# Patient Record
Sex: Female | Born: 1945 | Race: White | Hispanic: No | Marital: Married | Smoking: Never smoker
Health system: Southern US, Community
[De-identification: ages and names within clinical notes are randomized; demographics above are authoritative.]

## PROBLEM LIST (undated history)

## (undated) DIAGNOSIS — D510 Vitamin B12 deficiency anemia due to intrinsic factor deficiency: Secondary | ICD-10-CM

## (undated) DIAGNOSIS — F419 Anxiety disorder, unspecified: Secondary | ICD-10-CM

## (undated) DIAGNOSIS — G7100 Muscular dystrophy, unspecified: Secondary | ICD-10-CM

## (undated) DIAGNOSIS — I482 Chronic atrial fibrillation, unspecified: Secondary | ICD-10-CM

## (undated) HISTORY — DX: Vitamin B12 deficiency anemia due to intrinsic factor deficiency: D51.0

## (undated) HISTORY — DX: Anxiety disorder, unspecified: F41.9

## (undated) HISTORY — PX: TONSILLECTOMY: SUR1361

## (undated) HISTORY — PX: OTHER SURGICAL HISTORY: SHX169

## (undated) HISTORY — DX: Chronic atrial fibrillation, unspecified: I48.20

## (undated) HISTORY — DX: Muscular dystrophy, unspecified: G71.00

---

## 2006-01-15 ENCOUNTER — Ambulatory Visit: Payer: Self-pay | Admitting: Internal Medicine

## 2006-01-23 ENCOUNTER — Ambulatory Visit: Payer: Self-pay | Admitting: *Deleted

## 2006-02-04 ENCOUNTER — Ambulatory Visit: Payer: Self-pay | Admitting: Internal Medicine

## 2006-02-11 ENCOUNTER — Ambulatory Visit: Payer: Self-pay

## 2006-02-11 ENCOUNTER — Encounter: Payer: Self-pay | Admitting: Internal Medicine

## 2006-05-05 ENCOUNTER — Ambulatory Visit: Payer: Self-pay | Admitting: Internal Medicine

## 2006-05-05 LAB — CONVERTED CEMR LAB
Albumin: 3.8 g/dL (ref 3.5–5.2)
Basophils Absolute: 0 10*3/uL (ref 0.0–0.1)
CO2: 31 meq/L (ref 19–32)
Chloride: 104 meq/L (ref 96–112)
Creatinine, Ser: 0.7 mg/dL (ref 0.4–1.2)
Eosinophil percent: 2.4 % (ref 0.0–5.0)
HCT: 37.8 % (ref 36.0–46.0)
Hemoglobin: 12.8 g/dL (ref 12.0–15.0)
MCHC: 33.9 g/dL (ref 30.0–36.0)
MCV: 93.8 fL (ref 78.0–100.0)
Monocytes Absolute: 0.3 10*3/uL (ref 0.2–0.7)
Monocytes Relative: 5.5 % (ref 3.0–11.0)
Neutro Abs: 2.6 10*3/uL (ref 1.4–7.7)
Neutrophils Relative %: 56.4 % (ref 43.0–77.0)
RBC: 4.03 M/uL (ref 3.87–5.11)
RDW: 13.2 % (ref 11.5–14.6)
Sodium: 142 meq/L (ref 135–145)
Total Bilirubin: 0.5 mg/dL (ref 0.3–1.2)
Vitamin B-12: >1500 pg/mL — ABNORMAL HIGH (ref 211–911)

## 2006-05-26 ENCOUNTER — Other Ambulatory Visit: Admission: RE | Admit: 2006-05-26 | Discharge: 2006-05-26 | Payer: Self-pay | Admitting: Obstetrics & Gynecology

## 2006-07-02 ENCOUNTER — Encounter: Admission: RE | Admit: 2006-07-02 | Discharge: 2006-07-02 | Payer: Self-pay | Admitting: Obstetrics & Gynecology

## 2006-07-16 ENCOUNTER — Ambulatory Visit: Payer: Self-pay | Admitting: Internal Medicine

## 2006-08-13 ENCOUNTER — Ambulatory Visit: Payer: Self-pay | Admitting: Internal Medicine

## 2006-11-19 ENCOUNTER — Ambulatory Visit: Payer: Self-pay | Admitting: Internal Medicine

## 2006-11-19 ENCOUNTER — Encounter: Payer: Self-pay | Admitting: Internal Medicine

## 2006-11-19 DIAGNOSIS — G7109 Other specified muscular dystrophies: Secondary | ICD-10-CM

## 2006-11-19 DIAGNOSIS — D510 Vitamin B12 deficiency anemia due to intrinsic factor deficiency: Secondary | ICD-10-CM

## 2006-11-19 DIAGNOSIS — I4891 Unspecified atrial fibrillation: Secondary | ICD-10-CM | POA: Insufficient documentation

## 2007-03-17 ENCOUNTER — Ambulatory Visit (HOSPITAL_COMMUNITY): Admission: RE | Admit: 2007-03-17 | Discharge: 2007-03-17 | Payer: Self-pay | Admitting: Internal Medicine

## 2007-04-01 ENCOUNTER — Ambulatory Visit: Payer: Self-pay | Admitting: Internal Medicine

## 2007-04-06 ENCOUNTER — Ambulatory Visit: Payer: Self-pay | Admitting: Internal Medicine

## 2007-04-13 ENCOUNTER — Ambulatory Visit: Payer: Self-pay | Admitting: Cardiology

## 2007-04-15 ENCOUNTER — Encounter: Payer: Self-pay | Admitting: Internal Medicine

## 2007-04-15 ENCOUNTER — Ambulatory Visit: Payer: Self-pay

## 2007-04-20 ENCOUNTER — Ambulatory Visit: Payer: Self-pay | Admitting: Internal Medicine

## 2007-04-27 ENCOUNTER — Ambulatory Visit: Payer: Self-pay | Admitting: Cardiology

## 2007-05-11 ENCOUNTER — Ambulatory Visit: Payer: Self-pay | Admitting: Cardiology

## 2007-05-25 ENCOUNTER — Ambulatory Visit: Payer: Self-pay | Admitting: Internal Medicine

## 2007-06-30 ENCOUNTER — Ambulatory Visit: Payer: Self-pay | Admitting: Cardiovascular Disease

## 2007-07-20 ENCOUNTER — Other Ambulatory Visit: Admission: RE | Admit: 2007-07-20 | Discharge: 2007-07-20 | Payer: Self-pay | Admitting: Obstetrics and Gynecology

## 2007-07-28 ENCOUNTER — Ambulatory Visit: Payer: Self-pay | Admitting: Cardiology

## 2007-07-29 ENCOUNTER — Telehealth (INDEPENDENT_AMBULATORY_CARE_PROVIDER_SITE_OTHER): Payer: Self-pay | Admitting: *Deleted

## 2007-08-26 ENCOUNTER — Ambulatory Visit: Payer: Self-pay | Admitting: Cardiology

## 2007-09-01 ENCOUNTER — Ambulatory Visit (HOSPITAL_COMMUNITY): Admission: RE | Admit: 2007-09-01 | Discharge: 2007-09-01 | Payer: Self-pay | Admitting: Obstetrics and Gynecology

## 2007-09-24 ENCOUNTER — Ambulatory Visit: Payer: Self-pay | Admitting: Cardiovascular Disease

## 2007-10-26 ENCOUNTER — Ambulatory Visit: Payer: Self-pay | Admitting: Internal Medicine

## 2007-10-26 ENCOUNTER — Ambulatory Visit: Payer: Self-pay | Admitting: Cardiology

## 2007-11-23 ENCOUNTER — Ambulatory Visit: Payer: Self-pay | Admitting: Cardiology

## 2007-12-23 ENCOUNTER — Ambulatory Visit: Payer: Self-pay | Admitting: Cardiovascular Disease

## 2008-01-04 ENCOUNTER — Ambulatory Visit (HOSPITAL_COMMUNITY): Admission: RE | Admit: 2008-01-04 | Discharge: 2008-01-04 | Payer: Self-pay | Admitting: Internal Medicine

## 2008-01-25 ENCOUNTER — Ambulatory Visit: Payer: Self-pay | Admitting: Cardiology

## 2008-02-08 ENCOUNTER — Ambulatory Visit: Payer: Self-pay | Admitting: Cardiology

## 2008-02-15 ENCOUNTER — Ambulatory Visit: Payer: Self-pay | Admitting: Cardiology

## 2008-03-01 ENCOUNTER — Ambulatory Visit: Payer: Self-pay | Admitting: Cardiology

## 2008-03-14 ENCOUNTER — Ambulatory Visit: Payer: Self-pay | Admitting: Cardiology

## 2008-04-04 ENCOUNTER — Ambulatory Visit: Payer: Self-pay | Admitting: Cardiology

## 2008-05-09 ENCOUNTER — Ambulatory Visit: Payer: Self-pay | Admitting: Cardiology

## 2008-05-30 ENCOUNTER — Ambulatory Visit: Payer: Self-pay | Admitting: Cardiology

## 2008-06-27 ENCOUNTER — Ambulatory Visit: Payer: Self-pay | Admitting: Cardiology

## 2008-07-14 ENCOUNTER — Ambulatory Visit: Payer: Self-pay | Admitting: Cardiology

## 2008-08-01 ENCOUNTER — Other Ambulatory Visit: Admission: RE | Admit: 2008-08-01 | Discharge: 2008-08-01 | Payer: Self-pay | Admitting: Obstetrics and Gynecology

## 2008-08-04 ENCOUNTER — Ambulatory Visit: Payer: Self-pay | Admitting: Cardiology

## 2008-08-17 ENCOUNTER — Ambulatory Visit: Payer: Self-pay | Admitting: Cardiology

## 2008-08-18 ENCOUNTER — Ambulatory Visit: Payer: Self-pay

## 2008-08-22 ENCOUNTER — Ambulatory Visit: Payer: Self-pay | Admitting: Cardiology

## 2008-09-05 ENCOUNTER — Ambulatory Visit: Payer: Self-pay | Admitting: Cardiology

## 2008-09-12 ENCOUNTER — Ambulatory Visit (HOSPITAL_COMMUNITY): Admission: RE | Admit: 2008-09-12 | Discharge: 2008-09-12 | Payer: Self-pay | Admitting: Obstetrics and Gynecology

## 2008-09-22 DIAGNOSIS — I10 Essential (primary) hypertension: Secondary | ICD-10-CM | POA: Insufficient documentation

## 2008-09-22 DIAGNOSIS — R079 Chest pain, unspecified: Secondary | ICD-10-CM

## 2008-09-22 DIAGNOSIS — F411 Generalized anxiety disorder: Secondary | ICD-10-CM | POA: Insufficient documentation

## 2008-10-03 ENCOUNTER — Ambulatory Visit: Payer: Self-pay | Admitting: Cardiology

## 2008-11-03 ENCOUNTER — Ambulatory Visit: Payer: Self-pay | Admitting: Cardiology

## 2008-12-08 ENCOUNTER — Ambulatory Visit: Payer: Self-pay | Admitting: Cardiology

## 2009-01-12 ENCOUNTER — Ambulatory Visit: Payer: Self-pay | Admitting: Cardiology

## 2009-01-31 ENCOUNTER — Ambulatory Visit: Payer: Self-pay | Admitting: Internal Medicine

## 2009-02-01 ENCOUNTER — Telehealth: Payer: Self-pay | Admitting: Internal Medicine

## 2009-02-03 ENCOUNTER — Encounter (INDEPENDENT_AMBULATORY_CARE_PROVIDER_SITE_OTHER): Payer: Self-pay | Admitting: *Deleted

## 2009-02-06 ENCOUNTER — Encounter: Payer: Self-pay | Admitting: *Deleted

## 2009-02-09 ENCOUNTER — Ambulatory Visit: Payer: Self-pay | Admitting: Cardiology

## 2009-03-09 ENCOUNTER — Ambulatory Visit: Payer: Self-pay

## 2009-03-09 LAB — CONVERTED CEMR LAB: POC INR: 3.7

## 2009-04-10 ENCOUNTER — Ambulatory Visit: Payer: Self-pay | Admitting: Cardiology

## 2009-04-10 LAB — CONVERTED CEMR LAB: POC INR: 4.6

## 2009-04-26 ENCOUNTER — Ambulatory Visit: Payer: Self-pay | Admitting: Cardiology

## 2009-05-22 ENCOUNTER — Encounter: Payer: Self-pay | Admitting: Internal Medicine

## 2009-05-25 ENCOUNTER — Telehealth: Payer: Self-pay | Admitting: Cardiology

## 2009-05-25 ENCOUNTER — Encounter: Payer: Self-pay | Admitting: Cardiology

## 2009-05-25 LAB — CONVERTED CEMR LAB
INR: 2.07
Prothrombin Time: 23.1 s

## 2009-06-09 ENCOUNTER — Encounter: Payer: Self-pay | Admitting: Internal Medicine

## 2009-06-14 ENCOUNTER — Ambulatory Visit: Payer: Self-pay | Admitting: Cardiology

## 2009-06-14 LAB — CONVERTED CEMR LAB: POC INR: 2.1

## 2009-07-12 ENCOUNTER — Ambulatory Visit: Payer: Self-pay | Admitting: Cardiology

## 2009-08-10 ENCOUNTER — Ambulatory Visit: Payer: Self-pay | Admitting: Cardiology

## 2009-09-06 ENCOUNTER — Ambulatory Visit: Payer: Self-pay | Admitting: Cardiology

## 2009-09-14 ENCOUNTER — Ambulatory Visit (HOSPITAL_COMMUNITY): Admission: RE | Admit: 2009-09-14 | Discharge: 2009-09-14 | Payer: Self-pay | Admitting: Internal Medicine

## 2009-10-09 ENCOUNTER — Ambulatory Visit: Payer: Self-pay | Admitting: Cardiovascular Disease

## 2009-11-06 ENCOUNTER — Ambulatory Visit: Payer: Self-pay | Admitting: Cardiology

## 2009-12-04 ENCOUNTER — Ambulatory Visit: Payer: Self-pay | Admitting: Cardiology

## 2009-12-04 LAB — CONVERTED CEMR LAB: POC INR: 2.3

## 2010-01-01 ENCOUNTER — Ambulatory Visit: Payer: Self-pay | Admitting: Cardiology

## 2010-01-01 LAB — CONVERTED CEMR LAB: POC INR: 2.5

## 2010-01-15 ENCOUNTER — Ambulatory Visit (HOSPITAL_COMMUNITY): Admission: RE | Admit: 2010-01-15 | Discharge: 2010-01-15 | Payer: Self-pay | Admitting: Internal Medicine

## 2010-01-31 ENCOUNTER — Ambulatory Visit: Payer: Self-pay | Admitting: Cardiology

## 2010-01-31 LAB — CONVERTED CEMR LAB: POC INR: 3.6

## 2010-02-21 ENCOUNTER — Ambulatory Visit: Payer: Self-pay | Admitting: Cardiology

## 2010-02-21 LAB — CONVERTED CEMR LAB: POC INR: 3.1

## 2010-03-08 ENCOUNTER — Encounter: Payer: Self-pay | Admitting: Internal Medicine

## 2010-03-21 ENCOUNTER — Ambulatory Visit: Payer: Self-pay | Admitting: Cardiology

## 2010-03-21 LAB — CONVERTED CEMR LAB: POC INR: 4

## 2010-04-02 ENCOUNTER — Ambulatory Visit: Payer: Self-pay | Admitting: Internal Medicine

## 2010-04-04 ENCOUNTER — Telehealth: Payer: Self-pay | Admitting: Internal Medicine

## 2010-04-09 ENCOUNTER — Telehealth: Payer: Self-pay | Admitting: Cardiology

## 2010-04-10 ENCOUNTER — Encounter: Payer: Self-pay | Admitting: Cardiology

## 2010-04-10 ENCOUNTER — Encounter: Payer: Self-pay | Admitting: Internal Medicine

## 2010-04-10 ENCOUNTER — Telehealth: Payer: Self-pay | Admitting: Cardiology

## 2010-04-10 LAB — CONVERTED CEMR LAB: Prothrombin Time: 24.3 s — ABNORMAL HIGH (ref 11.6–15.2)

## 2010-04-11 ENCOUNTER — Ambulatory Visit: Payer: Self-pay | Admitting: Internal Medicine

## 2010-04-13 LAB — CONVERTED CEMR LAB
CO2: 29 meq/L (ref 19–32)
Chloride: 103 meq/L (ref 96–112)
Creatinine, Ser: 0.49 mg/dL (ref 0.40–1.20)
Free Thyroxine Index: 2.4 (ref 1.0–3.9)
Potassium: 4.2 meq/L (ref 3.5–5.3)
Sodium: 142 meq/L (ref 135–145)
TSH: 1.86 microintl units/mL (ref 0.350–4.500)
Triglycerides: 106 mg/dL (ref <150)

## 2010-05-02 ENCOUNTER — Ambulatory Visit: Payer: Self-pay | Admitting: Cardiology

## 2010-05-02 LAB — CONVERTED CEMR LAB: POC INR: 3.2

## 2010-05-30 ENCOUNTER — Ambulatory Visit: Payer: Self-pay | Admitting: Cardiovascular Disease

## 2010-05-30 ENCOUNTER — Ambulatory Visit (HOSPITAL_COMMUNITY)
Admission: RE | Admit: 2010-05-30 | Discharge: 2010-05-30 | Payer: Self-pay | Source: Home / Self Care | Admitting: Internal Medicine

## 2010-05-30 LAB — CONVERTED CEMR LAB: POC INR: 3.4

## 2010-06-27 ENCOUNTER — Ambulatory Visit: Admission: RE | Admit: 2010-06-27 | Discharge: 2010-06-27 | Payer: Self-pay | Source: Home / Self Care

## 2010-06-27 LAB — CONVERTED CEMR LAB: POC INR: 2.3

## 2010-07-24 NOTE — Medication Information (Signed)
Summary: ccr-lr  Anticoagulant Therapy  Managed by: Vashti Hey, RN PCP: Marisue Brooklyn Supervising MD: Diona Browner MD, Remi Deter Indication 1: Atrial Fibrillation (ICD-427.31) Lab Used: [facility] [place] Site: [place] INR POC 3.6  Dietary changes: no    Health status changes: yes       Details: chest congestion  Bleeding/hemorrhagic complications: no    Recent/future hospitalizations: no    Any changes in medication regimen? yes       Details: Took z pack 2 weeks ago   Levaquin x 5 days  started 01/30/10      Allergies: 1)  ! Biaxin  Anticoagulation Management History:      The patient is taking warfarin and comes in today for a routine follow up visit.  Negative risk factors for bleeding include an age less than 34 years old.  The bleeding index is 'low risk'.  Positive CHADS2 values include History of HTN.  Negative CHADS2 values include Age > 63 years old.  The start date was 06/24/2000.  Her last INR was 2.07.  Anticoagulation responsible provider: Diona Browner MD, Remi Deter.  INR POC: 3.6.  Cuvette Lot#: [id number].  Exp: 10/11.    Anticoagulation Management Assessment/Plan:      The patient's current anticoagulation dose is Warfarin sodium 5 mg tabs: Use as directed by Anticoagulation Clinic.  The target INR is 2 - 3.  The next INR is due 02/21/2010.  Anticoagulation instructions were given to patient.  Results were reviewed/authorized by Vashti Hey, RN.  She was notified by Vashti Hey RN.         Prior Anticoagulation Instructions: INR 2.5 Continue coumadin 10mg  once daily except 7.5mg  on Tuesdays and Saturdays  Current Anticoagulation Instructions: INR 3.6 Hold coumadin tonight, take 1 tablet on Thursday then resume 2 tablets once daily except 1 1/2 tablets on Tuesdays and Saturdays Has 4 days of Levaquin left

## 2010-07-24 NOTE — Medication Information (Signed)
Summary: ccr-lr  Anticoagulant Therapy  Managed by: Vashti Hey, RN PCP: Marisue Brooklyn Supervising MD: Dietrich Pates MD, Molly Maduro Indication 1: Atrial Fibrillation (ICD-427.31) Lab Used: [facility] [place] Site: [place] INR POC 2.7  Dietary changes: no    Health status changes: no    Bleeding/hemorrhagic complications: no    Recent/future hospitalizations: no    Any changes in medication regimen? no    Recent/future dental: no  Any missed doses?: no       Is patient compliant with meds? yes       Allergies: 1)  ! Biaxin  Anticoagulation Management History:      The patient is taking warfarin and comes in today for a routine follow up visit.  Negative risk factors for bleeding include an age less than 86 years old.  The bleeding index is 'low risk'.  Positive CHADS2 values include History of HTN.  Negative CHADS2 values include Age > 79 years old.  The start date was 06/24/2000.  Her last INR was 2.07.  Anticoagulation responsible provider: Dietrich Pates MD, Molly Maduro.  INR POC: 2.7.  Cuvette Lot#: [id number].  Exp: 10/11.    Anticoagulation Management Assessment/Plan:      The patient's current anticoagulation dose is Warfarin sodium 5 mg tabs: Use as directed by Anticoagulation Clinic.  The target INR is 2 - 3.  The next INR is due 12/04/2009.  Anticoagulation instructions were given to patient.  Results were reviewed/authorized by Vashti Hey, RN.  She was notified by Vashti Hey RN.         Prior Anticoagulation Instructions: INR 3.0 Continue coumadin 10mg  once daily except 7.5mg  on Tuesdays and Saturdays  Current Anticoagulation Instructions: INR 2.7 Continue coumadin 10mg  once daily except 7.5mg  on Tuesdays and Saturdays

## 2010-07-24 NOTE — Medication Information (Signed)
Summary: ccr-lr  Anticoagulant Therapy  Managed by: Vashti Hey, RN PCP: Marisue Brooklyn Supervising MD: Daleen Squibb MD, Maisie Fus Indication 1: Atrial Fibrillation (ICD-427.31) Lab Used: [facility] [place] Site: [place] INR POC 3.4  Dietary changes: no    Health status changes: no    Bleeding/hemorrhagic complications: no    Recent/future hospitalizations: no    Any changes in medication regimen? no    Recent/future dental: no  Any missed doses?: no       Is patient compliant with meds? yes       Allergies: 1)  ! Biaxin  Anticoagulation Management History:      The patient is taking warfarin and comes in today for a routine follow up visit.  Negative risk factors for bleeding include an age less than 51 years old.  The bleeding index is 'low risk'.  Positive CHADS2 values include History of HTN.  Negative CHADS2 values include Age > 89 years old.  The start date was 06/24/2000.  Her last INR was 2.07.  Anticoagulation responsible provider: Daleen Squibb MD, Maisie Fus.  INR POC: 3.4.  Cuvette Lot#: [id number].  Exp: 10/11.    Anticoagulation Management Assessment/Plan:      The patient's current anticoagulation dose is Warfarin sodium 5 mg tabs: Use as directed by Anticoagulation Clinic.  The target INR is 2 - 3.  The next INR is due 09/06/2009.  Anticoagulation instructions were given to patient.  Results were reviewed/authorized by Vashti Hey, RN.  She was notified by Vashti Hey RN.         Prior Anticoagulation Instructions: INR 3.9 Hold coumadin tonight then decrease dose to 2 tablets once daily except 1 1/2 tablets on Tuesdays and Saturdays  Current Anticoagulation Instructions: INR 3.4   Hold coumadin tonight then resume 10mg  once daily except 7.5mg  on Tuesdays and Saturdays

## 2010-07-24 NOTE — Medication Information (Signed)
Summary: ccr-lr  Anticoagulant Therapy  Managed by: Vashti Hey, RN PCP: Marisue Brooklyn Supervising MD: Eden Emms MD, Theron Arista Indication 1: Atrial Fibrillation (ICD-427.31) Lab Used: [facility]  Site: [place] INR POC 3.0  Dietary changes: no    Health status changes: no    Bleeding/hemorrhagic complications: no    Recent/future hospitalizations: no    Any changes in medication regimen? no    Recent/future dental: no  Any missed doses?: no       Is patient compliant with meds? yes       Allergies: 1)  ! Biaxin  Anticoagulation Management History:      The patient is taking warfarin and comes in today for a routine follow up visit.  Negative risk factors for bleeding include an age less than 69 years old.  The bleeding index is 'low risk'.  Positive CHADS2 values include History of HTN.  Negative CHADS2 values include Age > 16 years old.  The start date was 06/24/2000.  Her last INR was 2.07.  Anticoagulation responsible provider: Eden Emms MD, Theron Arista.  INR POC: 3.0.  Cuvette Lot#: [id number].  Exp: 10/11.    Anticoagulation Management Assessment/Plan:      The patient's current anticoagulation dose is Warfarin sodium 5 mg tabs: Use as directed by Anticoagulation Clinic.  The target INR is 2 - 3.  The next INR is due 11/06/2009.  Anticoagulation instructions were given to patient.  Results were reviewed/authorized by Vashti Hey, RN.  She was notified by Vashti Hey RN.         Prior Anticoagulation Instructions: INR 2.4 Continue coumadin 10mg  once daily except 7.5mg  on Tuesdays and Saturdays  Current Anticoagulation Instructions: INR 3.0 Continue coumadin 10mg  once daily except 7.5mg  on Tuesdays and Saturdays

## 2010-07-24 NOTE — Medication Information (Signed)
Summary: ccr-lr  Anticoagulant Therapy  Managed by: Vashti Hey, RN PCP: Marisue Brooklyn Supervising MD: Dietrich Pates MD, Molly Maduro Indication 1: Atrial Fibrillation (ICD-427.31) Lab Used: [facility] [place] Site: [place] INR POC 2.3  Dietary changes: no    Health status changes: no    Bleeding/hemorrhagic complications: no    Recent/future hospitalizations: no    Any changes in medication regimen? no    Recent/future dental: no  Any missed doses?: no       Is patient compliant with meds? yes       Allergies: 1)  ! Biaxin  Anticoagulation Management History:      The patient is taking warfarin and comes in today for a routine follow up visit.  Negative risk factors for bleeding include an age less than 30 years old.  The bleeding index is 'low risk'.  Positive CHADS2 values include History of HTN.  Negative CHADS2 values include Age > 87 years old.  The start date was 06/24/2000.  Her last INR was 2.07.  Anticoagulation responsible provider: Dietrich Pates MD, Molly Maduro.  INR POC: 2.3.  Cuvette Lot#: [id number].  Exp: 10/11.    Anticoagulation Management Assessment/Plan:      The patient's current anticoagulation dose is Warfarin sodium 5 mg tabs: Use as directed by Anticoagulation Clinic.  The target INR is 2 - 3.  The next INR is due 01/01/2010.  Anticoagulation instructions were given to patient.  Results were reviewed/authorized by Vashti Hey, RN.  She was notified by Vashti Hey RN.         Prior Anticoagulation Instructions: INR 2.7 Continue coumadin 10mg  once daily except 7.5mg  on Tuesdays and Saturdays  Current Anticoagulation Instructions: INR 2.3 Continue coumadin 10mg  once daily except 7.5mg  once daily on Tuesdays and Saturdays

## 2010-07-24 NOTE — Medication Information (Signed)
Summary: ccr-lr  Anticoagulant Therapy  Managed by: Vashti Hey, RN PCP: Marisue Brooklyn Supervising MD: Dietrich Pates MD, Molly Maduro Indication 1: Atrial Fibrillation (ICD-427.31) Lab Used: [facility] [place] Site: [place] INR POC 2.5  Dietary changes: no    Health status changes: no    Bleeding/hemorrhagic complications: no    Recent/future hospitalizations: no    Any changes in medication regimen? no    Recent/future dental: no  Any missed doses?: no       Is patient compliant with meds? yes       Allergies: 1)  ! Biaxin  Anticoagulation Management History:      The patient is taking warfarin and comes in today for a routine follow up visit.  Negative risk factors for bleeding include an age less than 46 years old.  The bleeding index is 'low risk'.  Positive CHADS2 values include History of HTN.  Negative CHADS2 values include Age > 75 years old.  The start date was 06/24/2000.  Her last INR was 2.07.  Anticoagulation responsible provider: Dietrich Pates MD, Molly Maduro.  INR POC: 2.5.  Cuvette Lot#: [id number].  Exp: 10/11.    Anticoagulation Management Assessment/Plan:      The patient's current anticoagulation dose is Warfarin sodium 5 mg tabs: Use as directed by Anticoagulation Clinic.  The target INR is 2 - 3.  The next INR is due 01/31/2010.  Anticoagulation instructions were given to patient.  Results were reviewed/authorized by Vashti Hey, RN.  She was notified by Vashti Hey RN.         Prior Anticoagulation Instructions: INR 2.3 Continue coumadin 10mg  once daily except 7.5mg  once daily on Tuesdays and Saturdays  Current Anticoagulation Instructions: INR 2.5 Continue coumadin 10mg  once daily except 7.5mg  on Tuesdays and Saturdays

## 2010-07-24 NOTE — Medication Information (Signed)
Summary: ccr-lr  Anticoagulant Therapy  Managed by: Vashti Hey, RN PCP: Marisue Brooklyn Supervising MD: Eden Emms MD, Theron Arista Indication 1: Atrial Fibrillation (ICD-427.31) Lab Used: [facility] [place] Site: [place] INR POC 3.4  Dietary changes: no    Health status changes: no    Bleeding/hemorrhagic complications: no    Recent/future hospitalizations: no    Any changes in medication regimen? no    Recent/future dental: no  Any missed doses?: no       Is patient compliant with meds? yes       Allergies: 1)  ! Biaxin  Anticoagulation Management History:      The patient is taking warfarin and comes in today for a routine follow up visit.  Negative risk factors for bleeding include an age less than 62 years old.  The bleeding index is 'low risk'.  Positive CHADS2 values include History of HTN.  Negative CHADS2 values include Age > 23 years old.  The start date was 06/24/2000.  Her last INR was 2.17.  Anticoagulation responsible provider: Eden Emms MD, Theron Arista.  INR POC: 3.4.  Cuvette Lot#: [id number].  Exp: 10/11.    Anticoagulation Management Assessment/Plan:      The patient's current anticoagulation dose is Warfarin sodium 5 mg tabs: Use as directed by Anticoagulation Clinic.  The target INR is 2 - 3.  The next INR is due 06/27/2010.  Anticoagulation instructions were given to patient.  Results were reviewed/authorized by Vashti Hey, RN.  She was notified by Vashti Hey RN.         Prior Anticoagulation Instructions: INR 3.2 Continue coumadin 10mg  once daily except 7.5mg  on Tuesdays and Saturdays  Current Anticoagulation Instructions: INR 3.4 Take coumadin 1 tablet tonight then resume 2 tablets once daily except 1 1/2 tablets on Tuesdays and Saturdays

## 2010-07-24 NOTE — Medication Information (Signed)
Summary: ccr-lr  Anticoagulant Therapy  Managed by: Vashti Hey, RN PCP: Marisue Brooklyn Supervising MD: Dietrich Pates MD, Molly Maduro Indication 1: Atrial Fibrillation (ICD-427.31) Lab Used: [facility] [place] Site: [place] INR POC 4.0  Dietary changes: no    Health status changes: no    Bleeding/hemorrhagic complications: no    Recent/future hospitalizations: no    Any changes in medication regimen? no    Recent/future dental: no  Any missed doses?: no       Is patient compliant with meds? yes       Allergies: 1)  ! Biaxin  Anticoagulation Management History:      The patient is taking warfarin and comes in today for a routine follow up visit.  Negative risk factors for bleeding include an age less than 72 years old.  The bleeding index is 'low risk'.  Positive CHADS2 values include History of HTN.  Negative CHADS2 values include Age > 12 years old.  The start date was 06/24/2000.  Her last INR was 2.07.  Anticoagulation responsible provider: Dietrich Pates MD, Molly Maduro.  INR POC: 4.0.  Cuvette Lot#: [id number].  Exp: 10/11.    Anticoagulation Management Assessment/Plan:      The patient's current anticoagulation dose is Warfarin sodium 5 mg tabs: Use as directed by Anticoagulation Clinic.  The target INR is 2 - 3.  The next INR is due 04/12/2010.  Anticoagulation instructions were given to patient.  Results were reviewed/authorized by Vashti Hey, RN.  She was notified by Vashti Hey RN.         Prior Anticoagulation Instructions: INR 3.1 Continue coumadin 10mg  once daily except 7.5mg  on Tuesdays and Saturdays  Current Anticoagulation Instructions: INR 4.0 Hold coumadin tonight, take 1 tablet tomorrow night then resume 2 tablets once daily except 1 1/2 tablets on Tuesdays and Saturdays

## 2010-07-24 NOTE — Assessment & Plan Note (Signed)
Summary: f1y  Medications Added AMLODIPINE BESYLATE 2.5 MG TABS (AMLODIPINE BESYLATE) Take one tablet by mouth daily VITAMIN B-12 500 MCG TABS (CYANOCOBALAMIN) Take 1 tablet by mouth once a day VITAMIN C 1000 MG TABS (ASCORBIC ACID) 2.000mg  daily        Visit Type:  1 year follow up Primary Provider:  Marisue Brooklyn  CC:  No complaints.  History of Present Illness: Hannah Villa is a 65 y/o woman with mild muscular dystrophy, chronic atrial fibrillation with h/o TIA and chest pain. She had a negative Myvoiew in 2008.  She saw Ward Givens in our office several months ago for recurrent CP a few days before her husband was going  in for prostate surgery. Had ETT/Myoview 08/18/08. Walked 8 mins on [address]. No CP. NO ST changes, Not gated due to AF. Mild fixed defect in anterior wall thought to be due to breast attenuation.  Returns for routine f/u. No longer exercising regularly (was walking on [place] for 30 mins/day). Has been apprehensive about her husband with recurrent prostate CA who is now undergoing chemo.   Denies CP or SOB. No palpitations, edema or orthopnea. No dizziness or presyncopal. Taking coumadin without bleeding.  Was seen in ophtho office in September and told that her retina had HTN changes. Got a cuff. SBPs 120-160s. (Averaging 135-140). Has had problems with hyperkalemia in past due to salt substitute.   Current Medications (verified): 1)  Metoprolol Succinate 25 Mg  Tb24 (Metoprolol Succinate) .Marland Kitchen.. 1 By Mouth Once Daily 2)  Aspirin 81 Mg Tbec (Aspirin) .... Take One Tablet By Mouth Daily 3)  Multivitamins   Tabs (Multiple Vitamin) .... Once Daily 4)  Calcium Carbonate-Vitamin D 1260mg /800 Iu  Tabs .... Once Daily 5)  Vitamin D (Ergocalciferol) 5000 Unit Caps (Ergocalciferol) .... Take One Tablet By Mouth Once Daily. 6)  Fish Oil   Oil (Fish Oil) .... 3000mg  Once Daily 7)  Flax   Oil 2600mg  (Flaxseed (Linseed)) .... Take 1 Tablet By Mouth Once A Day 8)  Zyrtec Allergy 10 Mg Tabs  (Cetirizine Hcl) .... Take One Tablet By Mouth Once Daily. 9)  Warfarin Sodium 5 Mg Tabs (Warfarin Sodium) .... Use As Directed By Anticoagulation Clinic 10)  Trazodone Hcl 50 Mg Tabs (Trazodone Hcl) .... 1/2 Tab At Bed Time 11)  Folic Acid 400 Mcg Tabs (Folic Acid) .Marland Kitchen.. 1 & 1/2 Tab Daily 12)  Magnesium + Zinc 400mg /15mg  .... Take 1 Tablet By Mouth Once A Day 13)  Vitamin B-12 500 Mcg Tabs (Cyanocobalamin) .... Take 1 Tablet By Mouth Once A Day 14)  Vitamin C 1000 Mg Tabs (Ascorbic Acid) .... 2.000mg  Daily  Allergies: 1)  ! Biaxin  Past History:  Past Medical History:  1. History of chest pain.     a.     Status post negative Myoview, 2008.     b.    ETT/Myoview 2/10.  Small fixed anterior defect. likely breast attenuation. No ischemia. 2. Chroni atrial fibrillation.     a.     Status post previous echocardiogram showing ejection      fraction of 65%.     b.     Restarted Coumadin therapy due to TIA in 2008 after being off coumadin for 1 year 3. History of muscular dystrophy.  (ICD-359.1) 4. Anxiety.  5. ANEMIA, PERNICIOUS (ICD-281.0)  Review of Systems       As per HPI and past medical history; otherwise all systems negative.   Vital Signs:  Patient profile:  65 year old female Height:      63 inches Weight:      139.50 pounds BMI:     24.80 Pulse rate:   48 / minute Pulse rhythm:   irregular Resp:     18 per minute BP sitting:   150 / 80  (left arm) Cuff size:   large  Vitals Entered By: Vikki Ports (April 02, 2010 11:16 AM)  Physical Exam  General:  Well appearing. no resp difficulty HEENT: normal Neck: supple. no JVD. Carotids 2+ bilat; no bruits. No lymphadenopathy or thryomegaly appreciated. Cor: PMI nondisplaced. Bradycardic. regular. No rubs, gallops, murmur. Lungs: clear Abdomen: soft, nontender, nondistended. Good bowel sounds. Extremities: no cyanosis, clubbing, rash, edema Neuro: alert & orientedx3, cranial nerves grossly intact. moves all 4  extremities w/o difficulty. affect pleasant    Impression & Recommendations:  Problem # 1:  ATRIAL FIBRILLATION (ICD-427.31) Is in sinus rhythm today (surprisingly) with marked sinus bradycardia and marked 1AVB. This is asymptomatic. Will stop lopressor. Continue coumadin. Will continue montiroing HRs on BP cuff. If persistently in the 40s or less will let us know and we will follow with 48 hr Holter. We did discuss the possibility of pacemaker in future. Will walk her around clinic today to see HR response to exercise. (got HR up to > 80 with mild hall walk which is good sign).    Problem # 2:  HYPERTENSION, UNSPECIFIED (ICD-401.9) BP elevated with evidence of end-organ damage (retinal changes). Will start amlodipine 2.5 once daily. Due for labs tomorrow including BMET (check k), thyroid, lipids and INR.   Other Orders: EKG w/ Interpretation (93000)  Patient Instructions: 1)  Your physician recommends that you schedule a follow-up appointment in: 6 months 2)  Your physician recommends that you return for fasting lab work tomorrow. 3)  Your physician has recommended you make the following change in your medication: Stop metoprolol. 4)  Start amlodipine 2.5 mg by mouth daily. Prescriptions: AMLODIPINE BESYLATE 2.5 MG TABS (AMLODIPINE BESYLATE) Take one tablet by mouth daily  #30 x 11   Entered by:   Dossie Arbour, RN, BSN   Authorized by:   Dolores Patty, MD, [facility]   Signed by:   Dossie Arbour, RN, BSN on 04/02/2010   Method used:   Electronically to        [facility] (retail)       [address]       [place], [place]  [address]       Ph: [phone] or [phone]       Fax: [phone]   RxID:   [id number]

## 2010-07-24 NOTE — Medication Information (Signed)
Summary: ccr-lr  Anticoagulant Therapy  Managed by: Vashti Hey, RN PCP: Marisue Brooklyn Supervising MD: Dietrich Pates MD, Molly Maduro Indication 1: Atrial Fibrillation (ICD-427.31) Lab Used: [facility] [place] Site: [place] INR POC 3.1  Dietary changes: no    Health status changes: no    Bleeding/hemorrhagic complications: no    Recent/future hospitalizations: no    Any changes in medication regimen? no    Recent/future dental: no  Any missed doses?: no       Is patient compliant with meds? yes       Allergies: 1)  ! Biaxin  Anticoagulation Management History:      Negative risk factors for bleeding include an age less than 78 years old.  The bleeding index is 'low risk'.  Positive CHADS2 values include History of HTN.  Negative CHADS2 values include Age > 65 years old.  The start date was 06/24/2000.  Her last INR was 2.07.  Anticoagulation responsible provider: Dietrich Pates MD, Molly Maduro.  INR POC: 3.1.  Exp: 10/11.    Anticoagulation Management Assessment/Plan:      The patient's current anticoagulation dose is Warfarin sodium 5 mg tabs: Use as directed by Anticoagulation Clinic.  The target INR is 2 - 3.  The next INR is due 03/21/2010.  Anticoagulation instructions were given to patient.  Results were reviewed/authorized by Vashti Hey, RN.         Prior Anticoagulation Instructions: INR 3.6 Hold coumadin tonight, take 1 tablet on Thursday then resume 2 tablets once daily except 1 1/2 tablets on Tuesdays and Saturdays Has 4 days of Levaquin left  Current Anticoagulation Instructions: INR 3.1 Continue coumadin 10mg  once daily except 7.5mg  on Tuesdays and Saturdays

## 2010-07-24 NOTE — Medication Information (Signed)
Summary: CCR  Anticoagulant Therapy  Managed by: Vashti Hey, RN PCP: Marisue Brooklyn Supervising MD: Daleen Squibb MD, Maisie Fus Indication 1: Atrial Fibrillation (ICD-427.31) Lab Used: [facility] [place] Site: [place] INR POC 2.4  Dietary changes: no    Health status changes: no    Bleeding/hemorrhagic complications: no    Recent/future hospitalizations: no    Any changes in medication regimen? no    Recent/future dental: no  Any missed doses?: no       Is patient compliant with meds? yes       Allergies: 1)  ! Biaxin  Anticoagulation Management History:      The patient is taking warfarin and comes in today for a routine follow up visit.  Negative risk factors for bleeding include an age less than 34 years old.  The bleeding index is 'low risk'.  Positive CHADS2 values include History of HTN.  Negative CHADS2 values include Age > 9 years old.  The start date was 06/24/2000.  Her last INR was 2.07.  Anticoagulation responsible provider: Daleen Squibb MD, Maisie Fus.  INR POC: 2.4.  Cuvette Lot#: [id number].  Exp: 10/11.    Anticoagulation Management Assessment/Plan:      The patient's current anticoagulation dose is Warfarin sodium 5 mg tabs: Use as directed by Anticoagulation Clinic.  The target INR is 2 - 3.  The next INR is due 10/09/2009.  Anticoagulation instructions were given to patient.  Results were reviewed/authorized by Vashti Hey, RN.  She was notified by Vashti Hey RN.         Prior Anticoagulation Instructions: INR 3.4   Hold coumadin tonight then resume 10mg  once daily except 7.5mg  on Tuesdays and Saturdays  Current Anticoagulation Instructions: INR 2.4 Continue coumadin 10mg  once daily except 7.5mg  on Tuesdays and Saturdays

## 2010-07-24 NOTE — Progress Notes (Signed)
Summary: coumadin management  Phone Note Outgoing Call   Call placed by: Vashti Hey RN,  April 10, 2010 1:53 PM Call placed to: Patient Reason for Call: Discuss lab or test results Summary of Call: Received fax from [facility] with results of PT/INR obtained on pt today.  PT 24.3  INR 2.17  Order given for pt to take coumadin 10mg  tonight then resume 10mg  once daily except 7.5mg  on Tuesdays and Saturdays. Initial call taken by: Vashti Hey RN,  April 10, 2010 1:58 PM     Anticoagulant Therapy  Managed by: Vashti Hey, RN PCP: Marisue Brooklyn Supervising MD: Diona Browner MD, Remi Deter Indication 1: Atrial Fibrillation (ICD-427.31) Lab Used: [facility] [place] Site: [place] PT 24.3  Dietary changes: no    Health status changes: no    Bleeding/hemorrhagic complications: no    Recent/future hospitalizations: no    Any changes in medication regimen? no    Recent/future dental: no  Any missed doses?: no       Is patient compliant with meds? yes         Anticoagulation Management History:      Her anticoagulation is being managed by telephone today.  Negative risk factors for bleeding include an age less than 21 years old.  The bleeding index is 'low risk'.  Positive CHADS2 values include History of HTN.  Negative CHADS2 values include Age > 15 years old.  The start date was 06/24/2000.  Her last INR was 2.17 and today's INR is 2.17.  Prothrombin time is 24.3.  Anticoagulation responsible provider: Diona Browner MD, Remi Deter.  Exp: 10/11.    Anticoagulation Management Assessment/Plan:      The patient's current anticoagulation dose is Warfarin sodium 5 mg tabs: Use as directed by Anticoagulation Clinic.  The target INR is 2 - 3.  The next INR is due 05/09/2010.  Anticoagulation instructions were given to patient.  Results were reviewed/authorized by Vashti Hey, RN.  She was notified by Vashti Hey RN.         Prior Anticoagulation Instructions: INR 4.0 Hold  coumadin tonight, take 1 tablet tomorrow night then resume 2 tablets once daily except 1 1/2 tablets on Tuesdays and Saturdays  Current Anticoagulation Instructions: INR 2.17 Take coumadin 10mg  tonight then resume 10mg  once daily except 7.5mg  on Tuesdays and Saturdays

## 2010-07-24 NOTE — Progress Notes (Signed)
Summary: Calling regarding having labs drawn.  Phone Note Call from Patient Call back at Home Phone [phone]   Caller: Patient Summary of Call: Pt calling with question about getting labs done in out [place] office,the pt husband is sick today and the pt don't want to drive here to this office. Initial call taken by: Judie Grieve,  April 04, 2010 8:22 AM  Follow-up for Phone Call        Left message to call back Meredith Staggers, RN  April 04, 2010 3:44 PM   spoke w/pt her husband was admitted to hospital so she hasn't gotten labs done, she will come in 1 day next week Meredith Staggers, RN  April 06, 2010 1:41 PM

## 2010-07-24 NOTE — Progress Notes (Signed)
Summary: PT WANTS TO TALK TO LISA  Phone Note Call from Patient Call back at Home Phone [phone]   Caller: PT Reason for Call: Talk to Nurse Summary of Call: PT WOULD LIKE TO SPEAK WITH LISA ABOUT A TEST THAT NEEDS TO BE DONE AND HER COUMADIN. Initial call taken by: Faythe Ghee,  April 09, 2010 9:02 AM  Follow-up for Phone Call        Wanted to have labs done in [place] instead of [place].  Lab orders fixed for pt to pick [place] this week.  Will call pt when I get results of INR. Follow-up by: Vashti Hey RN,  April 09, 2010 10:38 AM

## 2010-07-24 NOTE — Medication Information (Signed)
Summary: ccr-lr  Anticoagulant Therapy  Managed by: Vashti Hey, RN PCP: Marisue Brooklyn Supervising MD: Dietrich Pates MD, Molly Maduro Indication 1: Atrial Fibrillation (ICD-427.31) Lab Used: [facility] [place] Site: [place] INR POC 3.2  Dietary changes: no    Health status changes: no    Bleeding/hemorrhagic complications: no    Recent/future hospitalizations: no    Any changes in medication regimen? no    Recent/future dental: no  Any missed doses?: no       Is patient compliant with meds? yes       Allergies: 1)  ! Biaxin  Anticoagulation Management History:      The patient is taking warfarin and comes in today for a routine follow up visit.  Negative risk factors for bleeding include an age less than 60 years old.  The bleeding index is 'low risk'.  Positive CHADS2 values include History of HTN.  Negative CHADS2 values include Age > 29 years old.  The start date was 06/24/2000.  Her last INR was 2.17.  Anticoagulation responsible provider: Dietrich Pates MD, Molly Maduro.  INR POC: 3.2.  Cuvette Lot#: [id number].  Exp: 10/11.    Anticoagulation Management Assessment/Plan:      The patient's current anticoagulation dose is Warfarin sodium 5 mg tabs: Use as directed by Anticoagulation Clinic.  The target INR is 2 - 3.  The next INR is due 05/30/2010.  Anticoagulation instructions were given to patient.  Results were reviewed/authorized by Vashti Hey, RN.  She was notified by Vashti Hey RN.         Prior Anticoagulation Instructions: INR 2.17 Take coumadin 10mg  tonight then resume 10mg  once daily except 7.5mg  on Tuesdays and Saturdays  Current Anticoagulation Instructions: INR 3.2 Continue coumadin 10mg  once daily except 7.5mg  on Tuesdays and Saturdays

## 2010-07-24 NOTE — Letter (Signed)
Summary: [facility] Progress Note   [facility] Progress Note   Imported By: Roderic Ovens 04/13/2010 14:21:39  _____________________________________________________________________  External Attachment:    Type:   Image     Comment:   External Document

## 2010-07-24 NOTE — Medication Information (Signed)
Summary: ccr-lr  Anticoagulant Therapy  Managed by: Vashti Hey, RN PCP: Marisue Brooklyn Supervising MD: Diona Browner MD, Remi Deter Indication 1: Atrial Fibrillation (ICD-427.31) Lab Used: [facility] [place] Site: [place] INR POC 3.9  Dietary changes: no    Health status changes: no    Bleeding/hemorrhagic complications: yes       Details: ruptured blood vessel in eye  Recent/future hospitalizations: no    Any changes in medication regimen? no    Recent/future dental: no  Any missed doses?: no       Is patient compliant with meds? yes       Allergies: 1)  ! Biaxin  Anticoagulation Management History:      The patient is taking warfarin and comes in today for a routine follow up visit.  Negative risk factors for bleeding include an age less than 61 years old.  The bleeding index is 'low risk'.  Positive CHADS2 values include History of HTN.  Negative CHADS2 values include Age > 56 years old.  The start date was 06/24/2000.  Her last INR was 2.07.  Anticoagulation responsible provider: Diona Browner MD, Remi Deter.  INR POC: 3.9.  Cuvette Lot#: [id number].  Exp: 10/11.    Anticoagulation Management Assessment/Plan:      The patient's current anticoagulation dose is Warfarin sodium 5 mg tabs: Use as directed by Anticoagulation Clinic.  The target INR is 2 - 3.  The next INR is due 08/10/2009.  Anticoagulation instructions were given to patient.  Results were reviewed/authorized by Vashti Hey, RN.  She was notified by Vashti Hey RN.         Prior Anticoagulation Instructions: INR 2.1 Increase couamdin to 10mg  once daily except 7.5mg  on Tuesdays   Current Anticoagulation Instructions: INR 3.9 Hold coumadin tonight then decrease dose to 2 tablets once daily except 1 1/2 tablets on Tuesdays and Saturdays

## 2010-07-25 ENCOUNTER — Ambulatory Visit: Admit: 2010-07-25 | Payer: Self-pay

## 2010-07-26 NOTE — Medication Information (Signed)
Summary: ccr-lr  Anticoagulant Therapy  Managed by: Vashti Hey, RN PCP: Marisue Brooklyn Supervising MD: Dietrich Pates MD, Molly Maduro Indication 1: Atrial Fibrillation (ICD-427.31) Lab Used: [facility] [place] Site: [place] INR POC 2.3  Dietary changes: no    Health status changes: no    Bleeding/hemorrhagic complications: no    Recent/future hospitalizations: no    Any changes in medication regimen? no    Recent/future dental: no  Any missed doses?: no       Is patient compliant with meds? yes       Allergies: 1)  ! Biaxin  Anticoagulation Management History:      The patient is taking warfarin and comes in today for a routine follow up visit.  Negative risk factors for bleeding include an age less than 7 years old.  The bleeding index is 'low risk'.  Positive CHADS2 values include History of HTN.  Negative CHADS2 values include Age > 74 years old.  The start date was 06/24/2000.  Her last INR was 2.17.  Anticoagulation responsible provider: Dietrich Pates MD, Molly Maduro.  INR POC: 2.3.  Cuvette Lot#: [id number].  Exp: 10/11.    Anticoagulation Management Assessment/Plan:      The patient's current anticoagulation dose is Warfarin sodium 5 mg tabs: Use as directed by Anticoagulation Clinic.  The target INR is 2 - 3.  The next INR is due 07/25/2010.  Anticoagulation instructions were given to patient.  Results were reviewed/authorized by Vashti Hey, RN.  She was notified by Vashti Hey RN.         Prior Anticoagulation Instructions: INR 3.4 Take coumadin 1 tablet tonight then resume 2 tablets once daily except 1 1/2 tablets on Tuesdays and Saturdays  Current Anticoagulation Instructions: INR 2.3 Continue coumadin 10mg  once daily except 7.5mg  on Tuesdays and Saturdays

## 2010-07-30 ENCOUNTER — Encounter (INDEPENDENT_AMBULATORY_CARE_PROVIDER_SITE_OTHER): Payer: PRIVATE HEALTH INSURANCE

## 2010-07-30 ENCOUNTER — Encounter: Payer: Self-pay | Admitting: Cardiology

## 2010-07-30 DIAGNOSIS — Z7901 Long term (current) use of anticoagulants: Secondary | ICD-10-CM

## 2010-07-30 DIAGNOSIS — I4891 Unspecified atrial fibrillation: Secondary | ICD-10-CM

## 2010-07-30 LAB — CONVERTED CEMR LAB: POC INR: 2.5

## 2010-08-02 ENCOUNTER — Encounter (INDEPENDENT_AMBULATORY_CARE_PROVIDER_SITE_OTHER): Payer: Self-pay | Admitting: *Deleted

## 2010-08-09 NOTE — Letter (Signed)
Summary: Appointment - Missed  [facility] [place]  [address] [place] [address]   Phone: [phone]  Fax: [phone]     August 02, 2010 MRN: [id number]   [facility] [address] [place], [place]  [address]   Dear Ms. CLENNEY,  Our records indicate you missed your appointment on  07/25/10                      with Coumadin clinic       .                                    It is very important that we reach you to reschedule this appointment. We look forward to participating in your health care needs. Please contact us at the number listed above at your earliest convenience to reschedule this appointment.     Sincerely,    Glass blower/designer

## 2010-08-09 NOTE — Medication Information (Signed)
Summary: [facility] MISSED LAST APPT/TMJ  Anticoagulant Therapy Managed by: Vashti Hey, RN Patient Assessment Part 2:  Have you MISSED ANY DOSES or CHANGED TABLETS?  0  Have you had any BRUISING or BLEEDING ( nose or gum bleeds,blood in urine or stool)?  Have you STARTED or STOPPED any MEDICATIONS, including OTC meds,herbals or supplements?  Have you CHANGED your DIET, especially green vegetables,or ALCOHOL intake?  Have you had any ILLNESSES or HOSPITALIZATIONS?  Have you had any signs of CLOTTING?(chest discomfort,dizziness,shortness of breath,arms tingling,slurred speech,swelling or redness in leg)       Regimen Out:    Total Weekly: 65 mg mg  Next INR Due: 08/29/2010      Allergies: 1)  ! Biaxin  Anticoagulant Therapy  Managed by: Vashti Hey, RN PCP: Marisue Brooklyn Supervising MD: Dietrich Pates MD, Molly Maduro Indication 1: Atrial Fibrillation (ICD-427.31) Lab Used: [facility] [place] Site: [place] INR POC 2.5  Dietary changes: no    Health status changes: no    Bleeding/hemorrhagic complications: no    Recent/future hospitalizations: no    Any changes in medication regimen? no    Recent/future dental: no  Any missed doses?: no       Is patient compliant with meds? yes         Anticoagulation Management History:      The patient is taking warfarin and comes in today for a routine follow up visit.  Negative risk factors for bleeding include an age less than 59 years old.  The bleeding index is 'low risk'.  Positive CHADS2 values include History of HTN.  Negative CHADS2 values include Age > 44 years old.  The start date was 06/24/2000.  Her last INR was 2.17.  Anticoagulation responsible provider: Dietrich Pates MD, Molly Maduro.  INR POC: 2.5.  Cuvette Lot#: [id number].  Exp: 10/11.    Anticoagulation Management Assessment/Plan:      The patient's current anticoagulation dose is Warfarin sodium 5 mg tabs: Use as directed by Anticoagulation Clinic.   The target INR is 2 - 3.  The next INR is due 08/29/2010.  Anticoagulation instructions were given to patient.  Results were reviewed/authorized by Vashti Hey, RN.  She was notified by Vashti Hey RN.         Prior Anticoagulation Instructions: INR 2.3 Continue coumadin 10mg  once daily except 7.5mg  on Tuesdays and Saturdays  Current Anticoagulation Instructions: INR 2.5 Continue coumadin 10mg  once daily except 7.5mg  on Tuesdays and Saturdays

## 2010-08-30 ENCOUNTER — Encounter: Payer: Self-pay | Admitting: Cardiology

## 2010-08-30 ENCOUNTER — Encounter (INDEPENDENT_AMBULATORY_CARE_PROVIDER_SITE_OTHER): Payer: PRIVATE HEALTH INSURANCE

## 2010-08-30 DIAGNOSIS — I4891 Unspecified atrial fibrillation: Secondary | ICD-10-CM

## 2010-08-30 DIAGNOSIS — Z7901 Long term (current) use of anticoagulants: Secondary | ICD-10-CM

## 2010-08-30 LAB — CONVERTED CEMR LAB: POC INR: 2.2

## 2010-09-04 NOTE — Medication Information (Signed)
Summary: ccr-lr  Anticoagulant Therapy  Managed by: Vashti Hey, RN PCP: Marisue Brooklyn Supervising MD: Diona Browner MD, Remi Deter Indication 1: Atrial Fibrillation (ICD-427.31) Lab Used: [facility] [place] Site: [place] INR POC 2.2  Dietary changes: no    Health status changes: no    Bleeding/hemorrhagic complications: no    Recent/future hospitalizations: no    Any changes in medication regimen? no    Recent/future dental: no  Any missed doses?: no       Is patient compliant with meds? yes       Allergies: 1)  ! Biaxin  Anticoagulation Management History:      The patient is taking warfarin and comes in today for a routine follow up visit.  Negative risk factors for bleeding include an age less than 61 years old.  The bleeding index is 'low risk'.  Positive CHADS2 values include History of HTN.  Negative CHADS2 values include Age > 39 years old.  The start date was 06/24/2000.  Her last INR was 2.17.  Anticoagulation responsible provider: Diona Browner MD, Remi Deter.  INR POC: 2.2.  Cuvette Lot#: [id number].  Exp: 10/11.    Anticoagulation Management Assessment/Plan:      The patient's current anticoagulation dose is Warfarin sodium 5 mg tabs: Use as directed by Anticoagulation Clinic.  The target INR is 2 - 3.  The next INR is due 09/27/2010.  Anticoagulation instructions were given to patient.  Results were reviewed/authorized by Vashti Hey, RN.  She was notified by Vashti Hey RN.         Prior Anticoagulation Instructions: INR 2.5 Continue coumadin 10mg  once daily except 7.5mg  on Tuesdays and Saturdays  Current Anticoagulation Instructions: INR 2.2 Continue coumadin 10mg  once daily except 7.5mg  on Tuesdays and Saturdays

## 2010-09-25 ENCOUNTER — Other Ambulatory Visit: Payer: Self-pay | Admitting: Obstetrics and Gynecology

## 2010-09-25 DIAGNOSIS — M8580 Other specified disorders of bone density and structure, unspecified site: Secondary | ICD-10-CM

## 2010-09-25 DIAGNOSIS — Z1231 Encounter for screening mammogram for malignant neoplasm of breast: Secondary | ICD-10-CM

## 2010-09-26 ENCOUNTER — Encounter: Payer: Self-pay | Admitting: Internal Medicine

## 2010-09-26 DIAGNOSIS — I4891 Unspecified atrial fibrillation: Secondary | ICD-10-CM

## 2010-09-26 DIAGNOSIS — Z7901 Long term (current) use of anticoagulants: Secondary | ICD-10-CM

## 2010-09-27 ENCOUNTER — Ambulatory Visit (INDEPENDENT_AMBULATORY_CARE_PROVIDER_SITE_OTHER): Payer: PRIVATE HEALTH INSURANCE | Admitting: *Deleted

## 2010-09-27 DIAGNOSIS — Z7901 Long term (current) use of anticoagulants: Secondary | ICD-10-CM

## 2010-09-27 DIAGNOSIS — I4891 Unspecified atrial fibrillation: Secondary | ICD-10-CM

## 2010-10-04 ENCOUNTER — Ambulatory Visit (HOSPITAL_COMMUNITY)
Admission: RE | Admit: 2010-10-04 | Discharge: 2010-10-04 | Disposition: A | Discharge status: Home / Self Care | Payer: PRIVATE HEALTH INSURANCE | Source: Ambulatory Visit | Attending: Obstetrics and Gynecology | Admitting: Obstetrics and Gynecology

## 2010-10-04 DIAGNOSIS — Z1231 Encounter for screening mammogram for malignant neoplasm of breast: Secondary | ICD-10-CM | POA: Insufficient documentation

## 2010-10-04 DIAGNOSIS — M8580 Other specified disorders of bone density and structure, unspecified site: Secondary | ICD-10-CM

## 2010-10-09 ENCOUNTER — Other Ambulatory Visit: Payer: Self-pay | Admitting: Internal Medicine

## 2010-10-10 ENCOUNTER — Other Ambulatory Visit: Payer: Self-pay

## 2010-10-10 MED ORDER — WARFARIN SODIUM 5 MG PO TABS: 5.0000 mg | ORAL_TABLET | ORAL | Status: DC

## 2010-10-24 ENCOUNTER — Encounter: Payer: Self-pay | Admitting: Internal Medicine

## 2010-10-25 ENCOUNTER — Ambulatory Visit (INDEPENDENT_AMBULATORY_CARE_PROVIDER_SITE_OTHER): Payer: PRIVATE HEALTH INSURANCE | Admitting: *Deleted

## 2010-10-25 DIAGNOSIS — Z7901 Long term (current) use of anticoagulants: Secondary | ICD-10-CM

## 2010-10-25 DIAGNOSIS — I4891 Unspecified atrial fibrillation: Secondary | ICD-10-CM

## 2010-10-29 ENCOUNTER — Ambulatory Visit (INDEPENDENT_AMBULATORY_CARE_PROVIDER_SITE_OTHER): Payer: PRIVATE HEALTH INSURANCE | Admitting: Internal Medicine

## 2010-10-29 ENCOUNTER — Encounter: Payer: Self-pay | Admitting: Internal Medicine

## 2010-10-29 VITALS — BP 129/71 | HR 55 | Ht 63.0 in | Wt 139.0 lb

## 2010-10-29 DIAGNOSIS — I4891 Unspecified atrial fibrillation: Secondary | ICD-10-CM

## 2010-10-29 DIAGNOSIS — I10 Essential (primary) hypertension: Secondary | ICD-10-CM

## 2010-10-29 NOTE — Assessment & Plan Note (Addendum)
In sinus rhythm today with marked 1st AVB. Also significant bradycardia. At last visit we walked her around the office and she had good HR response. We talked about the fact that she likely has signifcant conduction disease and may need a pacer someday.. Continue coumadin. Will make sure she has thyroid panel from PCP for completeness sake.

## 2010-10-29 NOTE — Progress Notes (Signed)
HPI:  Hannah Villa is a 65 y/o woman with mild muscular dystrophy, paroxysmal atrial fibrillation with h/o TIA and chest pain. She had a negative Myvoiew in 2008. Last ECG (04/02/10) showed sinus brady with marked 1AVB ( ). AF documented on ECG 01/31/09/  Had ETT/Myoview 08/18/08 for CP. Walked 8 mins on [place]. No CP. NO ST changes, Not gated due to AF. Mild fixed defect in anterior wall thought to be due to breast attenuation.  Husband finished chemo for recurrent prostate CA and now they are planning to retire and move back to the [place] area. Has f/u with [facility]. (Previously saw Herby Abraham).   Denies CP or SOB. No palpitations, edema or orthopnea. No dizziness or presyncopal. BP well controlled. Has had problems with hyperkalemia in past due to salt substitute.    ROS: All systems negative except as listed in HPI, PMH and Problem List.  Past Medical History  Diagnosis Date  . Chest pain     a.     Status post negative Myoview, 2008. b.    ETT/Myoview 2/10.  Small fixed anterior defect. likely breast attenuation. No ischemia.  . Chronic atrial fibrillation     a.     Status post previous echocardiogram showing ejection  fraction of 65%. b.     Restarted Coumadin therapy due to TIA in 2008 after being off coumadin for 1 year  . Muscular dystrophy   . Anxiety   . Anemia, pernicious     Current Outpatient Prescriptions  Medication Sig Dispense Refill  . amLODipine (NORVASC) 2.5 MG tablet Take 2.5 mg by mouth daily.        . Ascorbic Acid (VITAMIN C) 1000 MG tablet Take 2,000 mg by mouth daily.        Marland Kitchen aspirin 81 MG EC tablet Take 81 mg by mouth daily.        Marland Kitchen CALCIUM-VITAMIN D PO Take 1 tablet by mouth. 1260mg        . cetirizine (ZYRTEC) 10 MG tablet Take 10 mg by mouth daily.        . Cholecalciferol 5000 UNITS capsule Take 5,000 Units by mouth daily.        . cyanocobalamin 500 MCG tablet Take 500 mcg by mouth daily.        . Flaxseed, Linseed, (FLAX SEED OIL PO)  Take 1 tablet by mouth daily. 2600mg        . folic acid ([employer] FOLIC ACID) 400 MCG tablet Take one and a half tabs daily.       . Multiple Vitamin (MULTIVITAMIN) tablet Take 1 tablet by mouth daily.        . NON FORMULARY Magnesium + Zinc 400mg /15mg  - Take 1 tablet by mouth daily.       . Omega-3 Fatty Acids (FISH OIL PO) Take by mouth daily. 3000mg        . traZODone (DESYREL) 50 MG tablet Take half a tab at bed time.      . VOLTAREN 1 % GEL       . warfarin (COUMADIN) 5 MG tablet Take 1 tablet (5 mg total) by mouth as directed. Take by mouth as directed.  60 tablet  1     PHYSICAL EXAM: Filed Vitals:   10/29/10 1046  BP: 129/71  Pulse: 55   General:  Well appearing. No resp difficulty HEENT: normal Neck: supple. JVP flat. Carotids 2+ bilaterally; no bruits. No lymphadenopathy or thryomegaly appreciated. Cor: PMI normal. Bradycardic and regular.. No rubs,  gallops or murmurs. Lungs: clear Abdomen: soft, nontender, nondistended. No hepatosplenomegaly. No bruits or masses. Good bowel sounds. Extremities: no cyanosis, clubbing, rash, edema Neuro: alert & orientedx3, cranial nerves grossly intact. Moves all 4 extremities w/o difficulty. Affect pleasant.    ECG:  Sinus brady 53 marked 1 AVB   ASSESSMENT & PLAN:

## 2010-10-29 NOTE — Assessment & Plan Note (Signed)
Blood pressure well controlled. Continue current regimen.  

## 2010-10-31 ENCOUNTER — Ambulatory Visit: Payer: PRIVATE HEALTH INSURANCE | Admitting: Internal Medicine

## 2010-11-06 NOTE — Assessment & Plan Note (Signed)
[facility]                            CARDIOLOGY OFFICE NOTE   Hannah Villa, Hannah Villa                        MRN:          [id number]  DATE:04/01/2007                            DOB:          1946/03/14    PRIMARY CARE PHYSICIAN:  Dr. Marisue Villa.   INTERVAL HISTORY:  Hannah Villa is a 65 year old woman with a history of  muscular dystrophy, hyperlipidemia, and chronic lone atrial  fibrillation.  She presents today for routine followup.   It appears that she has had a recent TIA.  She was sitting in the  hospital waiting room at [facility], getting an x-ray for her  back, when she had a waviness in her vision and then was unable to read  certain letters on the left side of her visual field.  This lasted a few  hours and then it resolved.  She saw an ophthalmologist, who said  everything was okay with her vision.  Also had an MRI, which showed some  small vessel ischemic changes, but no acute infarct.  She had another  episode of wavy vision for 5 to 10 minutes a few days later, and this  has resolved.  She saw her PCP and they started her on Plavix.  She has  an appointment scheduled for Hannah Villa in Neurology in mid November.   She also reports some occasional chest pressure.  It can come at any  time.  It is not necessarily related to exertion.  Several months ago,  before her mother got sick, she was walking on the treadmill for 50  minutes at a time without any problems, but she has not done this of  late.  She denies any palpitations.  She has not had any dyspnea.   CURRENT MEDICATIONS:  1. Toprol XL 25 a day.  2. Trazodone.  3. Multivitamin.  4. Iron.  5. Calcium.  6. Vitamin D.  7. Omega fish oil 2000 mg a day.  8. Aspirin 325 a day.  9. Plavix 75 a day.  10.Folic acid.   PHYSICAL EXAM:  She is well-appearing.  In no acute distress.  Ambulates  around the clinic without any respiratory difficulty.  Blood pressure 120/61, heart  rate 75, weight 136.  HEENT:  Normal.  NECK:  Supple.  There is no JVD.  Carotids are 2+ bilaterally without  any bruits.  There is no lymphadenopathy or thyromegaly.  CARDIAC:  PMI is not displaced.  She is irregularly irregular.  No  murmur.  LUNGS:  Clear.  ABDOMEN:  Soft, nontender, nondistended.  There is no  hepatosplenomegaly.  No bruits.  No masses.  Good bowel sounds.  EXTREMITIES:  Warm with no cyanosis, clubbing, or edema.  No rash.  NEURO:  Alert and oriented x3.  Cranial nerves 2-12 are intact.  Finger  to nose examination is normal.  There is no pronator drift.  She moves  all 4 extremities without difficulty.  Affect is pleasant.   ELECTROCARDIOGRAM:  Shows atrial fibrillation with a ventricular  response of 75 beats per minute.  No ST-T wave  abnormalities.   ASSESSMENT:  1. Possible transient ischemic attack.  We will check a carotid      ultrasound and a 2D echocardiogram.  She will be referred to Dr.      Sandria Villa for further evaluation.  2. Chest pressure.  This is mostly atypical, but she has not had a      recent stress test.  We will go ahead and get a treadmill Myoview.  3. Atrial fibrillation.  She is well rate-controlled.  She had      numerous questions about morbidity and mortality about this.  We      discussed [place] score.  We will now start her back on Coumadin and      have her follow up in the Coumadin Clinic at [facility].  She will      also need a Statin.  We will start her on simvastatin 20.     Hannah Buckles. Bensimhon, MD  Electronically Signed    DRB/MedQ  DD: 04/01/2007  DT: 04/02/2007  Job #: [id number]

## 2010-11-06 NOTE — Assessment & Plan Note (Signed)
[facility]                            CARDIOLOGY OFFICE NOTE   Hannah Villa, Hannah Villa                        MRN:          [id number]  DATE:08/17/2008                            DOB:          [birth year]    PRIMARY CARDIOLOGIST:  Bevelyn Buckles. Bensimhon, MD.   PRIMARY CARE Braelynne Garinger:  Lovenia Kim, D.O.   PATIENT PROFILE:  A 65 year old Caucasian female with a prior history of  atrial fibrillation who presents secondary to chest discomfort.   PROBLEMS:  1. History of chest pain.      a.     Status post negative Myoview, 2008.  2. History of atrial fibrillation.      a.     Status post previous echocardiogram showing ejection       fraction of 65%.      b.     On chronic Coumadin therapy.  3. History of hypertension.  4. History of muscular dystrophy.  5. Anxiety.   HISTORY OF PRESENT ILLNESS:  A 65 year old Caucasian female with the  above problem list.  She has chronic atrial fibrillation and is  asymptomatic.  She is also on chronic Coumadin therapy.  She was in her  usual state of health until yesterday evening, shortly before dinner  when she noted a vague 1/10 discomfort in her upper abdomen under her  left breast.  There were no associated symptoms.  There were no  limitations in activity as a result of these symptoms.  There was no  change in symptoms with positioning, palpation, deep breathing or  coughing.  She reports that she has been under a lot of stress lately as  her husband has been diagnosed with prostate cancer and is pending  surgery next Wednesday.  She says that was undermined for good portion  of the evening and night last night and had trouble sleeping and was  restless.  She also noted, in her restlessness, that she continued to  have this vague 1/10 to 2/10 discomfort under her left breast.  She  finally fell asleep at 5:00 a.m. and when she woke up at 7:00 a.m., her  discomfort was gone.   She also reports having sinus  congestion since over this weekend.  She  had been taking over the counter combination tablet including  guaifenesin, phenylephrine, and acetaminophen.  She was concerned that  this may have been contributing to her symptoms.  Currently, she is pain  free and in good spirits.   HOME MEDICATIONS:  1. Toprol-XL 25 mg daily.  2. Trazodone 250/50 nightly.  3. Multivitamin daily.  4. Magnesium 500 mg plus 30 mg zinc daily.  5. Calcium daily.  6. Vitamin D daily  7. Aspirin 81 mg daily.  8. Coumadin as directed.  9. Omega-3 fish oil daily.   PHYSICAL EXAMINATION:  VITAL SIGNS:  Blood pressure 128/75, heart rate  93, respirations 16.  She weighed 140 pounds.  GENERAL:  A pleasant white female, in no acute distress.  Awake, alert,  and oriented x3.  HEENT:  Normal.  NEUROLOGIC:  Grossly intact.  Nonfocal.  PSYCHIATRIC:  Normal affect.  SKIN:  Warm and dry without lesions or masses.  MUSCULOSKELETAL:  Grossly normal without deformity or effusions.  LUNGS:  Respirations regular and clear to auscultation.  NECK:  No bruits, JVD.  CARDIAC:  Irregularly irregular S1, S2.  No S3, S4, or murmurs.  ABDOMEN:  Round, soft, nontender, nondistended.  Bowel sounds are  present.  EXTREMITIES:  Warm, dry, and pink.  No clubbing, cyanosis, or edema.  Dorsalis pedis and posterior tibial pulses are 2+ bilaterally.   Exercise Myoview is pending.   ASSESSMENT AND PLAN:  1. Atypical chest pain.  The patient had discomfort under her left      breast in the left upper abdomen throughout the evening last night.      This did not limit her activities and she had no associated      symptoms.  She has had negative Myoview in 2008 and we will repeat      Myoview tomorrow.  She is advised that if she has recurrent      symptoms that we strongly consider presenting to the emergency room      for further evaluation.  She is currently pain free.  I did offer      her hospital admission; however, the patient  wished to defer and      instead be worked up as an outpatient.  2. Atrial fibrillation, this is chronic.  The patient on chronic      Coumadin and is well rate controlled on beta-blocker therapy.   DISPOSITION:  Exercise Myoview in the a.m.  Follow up with Dr. Gala Romney  in the next 2-4 weeks.     Nicolasa Ducking, ANP  Electronically Signed      Rollene Rotunda, MD, [facility]  Electronically Signed   CB/MedQ  DD: 08/17/2008  DT: 08/18/2008  Job #: [id number]

## 2010-11-06 NOTE — Assessment & Plan Note (Signed)
[facility]                            CARDIOLOGY OFFICE NOTE   Hannah, Villa                        MRN:          [id number]  DATE:10/26/2007                            DOB:          10-15-1945    PRIMARY CARE PHYSICIAN:  Dr. Marisue Brooklyn.   INTERVAL HISTORY:  Hannah Villa is a very pleasant 65 year old woman with  a history of muscular dystrophy, hyperlipidemia, and chronic atrial  fibrillation.  At her last visit, we started her back on Coumadin due to  a TIA.  She presents today for routine follow-up.   Overall, she is doing very well.  She has been active.  She walks on the  treadmill several days a week, speed to 3.2-3.3 miles an hour.  She  feels like she is limited more by her muscular dystrophy than her atrial  fibrillation.  She did have some chest pain and got a Myoview which  showed breast attenuation but no ischemia.  Echocardiogram showed an EF  of 65%.  She has not had any problems with bleeding.  Her Coumadin has  been in a good range.  Today, her INR was 2.4.  She has not had heart  failure, though she does sometimes have some mild lower extremity edema.   CURRENT MEDICATIONS:  1. Toprol XL 25 a day.  2. Trazodone.  3. Multivitamin.  4. Calcium.  5. Vitamin D.  6. Magnesium.  7. Folic acid.  8. Aspirin 81.  9. Coumadin.  10.Omega 3.   PHYSICAL EXAMINATION:  She is vigorous appearing in no acute distress,  ambulates around the clinic without any respiratory difficulty.  Blood  pressure is 124/82, heart rate 66, weight 138 which is stable.  HEENT:  Is normal.  NECK:  Supple.  No JVD.  Carotid 2+ bilaterally without bruits.  There  is no lymphadenopathy or thyromegaly.  CARDIAC:  PMI is nondisplaced.  She is irregular irregular.  No murmur.  LUNGS:  Clear.  ABDOMEN:  Soft, nontender, nondistended.  No hepatosplenomegaly, no  bruits, no masses.  Good bowel sounds.  EXTREMITIES:  Warm with no cyanosis, clubbing or edema.   No rash.  NEUROLOGICAL:  Alert and x3.  Cranial nerves II-XII are grossly intact.  Moves all four extremities without difficulty.   EKG shows atrial fibrillation at a rate of 66 with PVCs.  No significant  ST-T wave abnormalities.   ASSESSMENT/PLAN:  1. Chronic atrial fibrillation.  She is doing well on Coumadin.  Rate      is well-controlled.  Continue current therapy.   DISPOSITION:  Will see her back in clinic in 9 months for routine follow-  up.     Hannah Buckles. Bensimhon, MD  Electronically Signed    DRB/MedQ  DD: 10/26/2007  DT: 10/26/2007  Job #: [id number]

## 2010-11-09 NOTE — Assessment & Plan Note (Signed)
[facility]ALTHCARE                            CARDIOLOGY OFFICE NOTE   LECRETIA, BUCZEK                        MRN:          [id number]  DATE:08/13/2006                            DOB:          1945/06/28    PRIMARY CARE PHYSICIAN:  Titus Dubin. Alwyn Ren, MD,FACP,FCCP.   INTERVAL HISTORY:  [name] is a delightful 65 year old woman with a  history of muscular dystrophy as well as hyperlipidemia.  She also has  chronic lone atrial fibrillation.  She presents today for routine  followup.  She tells me that  she is doing quite well.  She denies any  chest pain or shortness of breath.  No palpitations.  After our  discussion last visit, she actually went ahead and stopped her Coumadin  and is now just taking aspirin for stroke prevention.  She is walking on  the treadmill some without any symptoms.   Current medications are Toprol XL 25 a day, trazodone, multivitamin,  iron, calcium, vitamin D, magnesium, Omega 3 fish oil, and aspirin 325 a  day.   PHYSICAL EXAMINATION:  GENERAL:  She is well-appearing in no acute  distress.  Ambulates around the clinic without any respiratory  difficulty.  VITAL SIGNS:  Blood pressure is 122/70.  Heart rate is 63.  HEENT:  Sclerae are anicteric.  EOMI.  There is no xanthelasma.  Mucous  membranes are moist.  Oropharynx is clear.  NECK:  Supple.  No JVD.  Carotids are 2+ bilaterally without any bruits.  There is no lymphadenopathy or thyromegaly.  CARDIAC:  She has distant heart sounds with an irregular rhythm.  There  are no murmurs, rubs or gallops.  LUNGS:  Clear.  ABDOMEN:  Soft nontender, nondistended.  No hepatosplenomegaly.  No  bruits.  No masses.  Good bowel sounds.  EXTREMITIES:  Warm with no clubbing, cyanosis, or edema.  No rashes.  NEURO:  She is alert and oriented x3.  Cranial nerves II-XII are intact.  Moves all four extremities without difficulty.   EKG shows chronic atrial fibrillation at a rate of 63.   No significant  ST-T wave abnormalities.   ASSESSMENT/PLAN:  1. Chronic lone atrial fibrillation.  She is doing quite well.  She is      well rate-controlled.  We had a long talk about the fact that she      may need Coumadin in the future should she develop other risk      factors such as hypertension.  I have urged her to keep a very      active lifestyle in an effort to ward off diastolic dysfunction and      hypertensive changes.  We will see her back      in six months.  2. Hyperlipidemia:  Followed by Dr. Alwyn Ren.     Bevelyn Buckles. Bensimhon, MD  Electronically Signed    DRB/MedQ  DD: 08/13/2006  DT: 08/13/2006  Job #: [id number]   cc:   Titus Dubin. Alwyn Ren, MD,FACP,FCCP

## 2010-11-09 NOTE — Assessment & Plan Note (Signed)
[facility]                              CARDIOLOGY OFFICE NOTE   Hannah Villa, IPOCK                        MRN:          [id number]  DATE:02/04/2006                            DOB:          06-Sep-1945    CARDIOLOGY CONSULTATION.   PRIMARY CARE PHYSICIAN REFERRING PHYSICIAN:  Titus Dubin. Alwyn Ren, MD, FACP,  FCCP.   PATIENT IDENTIFICATION:  Hannah Villa is a very pleasant 65 year old woman  with a history of chronic atrial fibrillation who just moved to the area  from [facility] and presents to establish long term cardiac care.   HISTORY OF PRESENT ILLNESS:  Hannah Villa was previously followed by Dr. Ivin Booty, in [facility].  Apparently she has a history of irregular  heartbeat back into the 80's, but was not formally diagnosed with atrial  fibrillation in 2002.  At that time she also had an echocardiogram which  reportedly showed three mildly leaking valves as well as normal LV function.  Also had a treadmill test which was reportedly negative.  While being  followed in [place], she had a thorough discussion about the possibilities  with atrial fibrillation including rate control therapy, rhythm control  therapy or cardioversion.  She has done very well with rate control therapy.  She has been maintained on Coumadin and followed by their INR clinic.   PAST MEDICAL HISTORY:  Limb girdle muscular dystrophy which was diagnosed in  1994.  She has had some functional limitation with this but is still able to  do all of her activities of daily living without significant problems.  She  does have a hard time walking hills and bending down to pick up objects and  she feels that this is getting somewhat progressive.  She denies any  significant palpitations.  She has not had any heart failure symptoms.  She  does have occasional very transient chest pressure and feels the need to  take a deep breath, but this has never been sustained and is  not associated  with exertion.  She has not had syncope or presyncope.   REVIEW OF SYSTEMS:  Notable for some allergies and hay fever as well as  anemia and some muscular weakness due to muscular dystrophy; otherwise, all  other review of systems is negative except for HPI and Problem List.   PROBLEM LIST:  1. Chronic atrial fibrillation.      a.     Previous echocardiogram showed normal left ventricular function       with mild mitral regurgitation, aortic regurgitation and tricuspid       regurgitation.  2. Limb girdle muscular dystrophy diagnosed in 1994.   She denies any history of high blood pressure, previous strokes or diabetes.   CURRENT MEDICATIONS:  1. Coumadin.  2. Toprol-XL 25.  3. Trazodone.  4. Multivitamin.  5. Magnesium 400 mg.  6. Zinc 15 mg.  7. Iron.  8. Calcium.  9. Vitamin D.   DRUG ALLERGIES:  BIAXIN AND SURGICAL ANESTHESIA WHICH CAUSED NAUSEA.   SOCIAL HISTORY:  She is a retired Engineer, site.  She  just moved from  [place] with her husband, who is a Geophysicist/field seismologist, and she is now  living in [place].  She denies any history of tobacco or alcohol use.   FAMILY HISTORY:  Notable for her mother who is 19 years old and alive.  Has  history of stroke and hypertension.  Father died at age 59 due to a heart  attack.  A sister and a brother who are in good health.   PHYSICAL EXAM:  She is well appearing, quite vital, with no acute distress,  she ambulates in the clinic freely, respirations are unlabored.  Blood  pressure is 112/70 with a heart rate of 69, her weight is 131.  HEENT:  Sclera anicteric.  EOMI.  There is no xanthelasma.  Mucous membranes  are moist.  NECK:  Supple, there is no JVD.  Carotids are 2+ bilaterally without any  bruits.  There is no lymphadenopathy or thyromegaly.  CARDIAC:  She is regular.  There is no murmur or rub.  LUNGS:  Clear.  ABDOMEN:  Soft, nontender, nondistended.  There is no hepatosplenomegaly, no  bruits, no  masses.  EXTREMITIES:  Warm with no cyanosis, clubbing or edema.  There is good  distal pulses 2+ bilaterally.  She does have some mild wasting of her  quadriceps muscles.  NEURO:  She is alert and oriented x3.  Cranial nerves II-XII are intact.  She moves all four extremities without any difficulty.  Her strength is  grossly normal.   EKG shows atrial fibrillation with a ventricular response of 69 b.p.m. and  intraventricular conduction delay.   ASSESSMENT:  1. Atrial fibrillation, chronic.  She has done quite well with rate      control therapy and we will continue this.  We had a long discussion      about what degree of anticoagulation she needs.  Essentially, she has      lone atrial fibrillation and I have suggested that the risk/benefit      profile of Coumadin in this situation are probably not in her favor and      have suggested that she go and she switch over to 325 mg of aspirin a      day.  I gave her some information on the CHADS score and I have asked      her to research this more on the Internet, and she will get back to me      concerning her decision.  We will check a 2D echocardiogram to      establish a baseline.  2. Hyperlipidemia.  This will be followed by Dr. Alwyn Ren.  She is somewhat      reluctant to start on any new medications.  She would like a trial of      exercise and diet to see if she can control.  I told her I thought this      was quite reasonable and she will have a copy of her lipids faxed to      me.   DISPOSITION:  We will see her back in clinic in several months for routine  follow up.                                Bevelyn Buckles. Bensimhon, MD    DRB/MedQ  DD:  02/04/2006  DT:  02/04/2006  Job #:  [id number]   cc:   Titus Dubin.  Alwyn Ren, MD, FACP, [facility]

## 2010-11-12 ENCOUNTER — Other Ambulatory Visit (HOSPITAL_COMMUNITY): Payer: Self-pay | Admitting: Internal Medicine

## 2010-11-12 ENCOUNTER — Ambulatory Visit (HOSPITAL_COMMUNITY)
Admission: RE | Admit: 2010-11-12 | Discharge: 2010-11-12 | Disposition: A | Discharge status: Home / Self Care | Payer: PRIVATE HEALTH INSURANCE | Source: Ambulatory Visit | Attending: Internal Medicine | Admitting: Internal Medicine

## 2010-11-12 DIAGNOSIS — R52 Pain, unspecified: Secondary | ICD-10-CM

## 2010-11-12 DIAGNOSIS — S60229A Contusion of unspecified hand, initial encounter: Secondary | ICD-10-CM | POA: Insufficient documentation

## 2010-11-12 DIAGNOSIS — W208XXA Other cause of strike by thrown, projected or falling object, initial encounter: Secondary | ICD-10-CM | POA: Insufficient documentation

## 2010-11-12 DIAGNOSIS — M79609 Pain in unspecified limb: Secondary | ICD-10-CM | POA: Insufficient documentation

## 2010-12-05 ENCOUNTER — Telehealth: Payer: Self-pay | Admitting: Internal Medicine

## 2010-12-06 NOTE — Telephone Encounter (Signed)
Pt going out of town and needs her warfarin today if possible

## 2010-12-17 ENCOUNTER — Ambulatory Visit (INDEPENDENT_AMBULATORY_CARE_PROVIDER_SITE_OTHER): Payer: PRIVATE HEALTH INSURANCE | Admitting: *Deleted

## 2010-12-17 DIAGNOSIS — I4891 Unspecified atrial fibrillation: Secondary | ICD-10-CM

## 2010-12-17 DIAGNOSIS — Z7901 Long term (current) use of anticoagulants: Secondary | ICD-10-CM

## 2013-07-01 ENCOUNTER — Telehealth (HOSPITAL_COMMUNITY): Payer: Self-pay | Admitting: Cardiology

## 2013-07-01 NOTE — Telephone Encounter (Signed)
[facility] is a great group. Any doc there would be fine.

## 2013-07-01 NOTE — Telephone Encounter (Signed)
Former pt of Dr.Bensimhon still living in the [place] area. Current cardiologist is going to retire, would like to know who Dr Gala RomneyBensimhon would recommend her to see in the  [facility]?

## 2013-07-02 NOTE — Telephone Encounter (Signed)
Pt aware.

## 2021-02-20 LAB — URINALYSIS W/ RFLX MICROSCOPIC
Bilirubin Urine: NEGATIVE
Glucose, UA: NEGATIVE
Leukocyte Esterase, Urine: NEGATIVE
Nitrite, Urine: NEGATIVE
Protein, UA: NEGATIVE
Specific Gravity, UA: 1.01 (ref 1.003–1.035)
Urobilinogen, Urine: 0.2 EU/dL
pH, UA: 7 (ref 4.5–8.0)

## 2021-02-20 LAB — BASIC METABOLIC PANEL
Anion Gap: 10 mmol/L (ref 2–17)
BUN: 11 mg/dL (ref 8–23)
CO2: 30 mmol/L — ABNORMAL HIGH (ref 22–29)
Calcium: 9.7 mg/dL (ref 8.8–10.2)
Chloride: 99 mmol/L (ref 98–107)
Creatinine: 0.4 mg/dL — ABNORMAL LOW (ref 0.5–1.0)
Est, Glom Filt Rate: 104 mL/min/1.73m?? (ref >=90)
Glucose: 89 mg/dL (ref 70–99)
OSMOLALITY CALCULATED: 276 mOsm/kg (ref 270–287)
Potassium: 3.5 mmol/L (ref 3.5–5.3)
Sodium: 139 mmol/L (ref 135–145)

## 2021-02-20 LAB — CBC WITH AUTO DIFFERENTIAL
Absolute Baso #: 0 10*3/uL (ref 0.0–0.2)
Absolute Eos #: 0.1 10*3/uL (ref 0.0–0.5)
Absolute Lymph #: 2 10*3/uL (ref 1.0–3.2)
Absolute Mono #: 0.5 10*3/uL (ref 0.3–1.0)
Basophils %: 0.5 % (ref 0.0–2.0)
Eosinophils %: 2.3 % (ref 0.0–7.0)
Hematocrit: 37.5 % (ref 34.0–47.0)
Hemoglobin: 12.2 g/dL (ref 11.5–15.7)
Immature Grans (Abs): 0.01 10*3/uL (ref 0.00–0.06)
Immature Granulocytes: 0.2 % (ref 0.0–0.6)
Lymphocytes: 35.5 % (ref 15.0–45.0)
MCH: 30.8 pg (ref 27.0–34.5)
MCHC: 32.5 g/dL (ref 32.0–36.0)
MCV: 94.7 fL (ref 81.0–99.0)
MPV: 8.9 fL (ref 7.2–13.2)
Monocytes: 9.4 % (ref 4.0–12.0)
Neutrophils %: 52.1 % (ref 42.0–74.0)
Neutrophils Absolute: 2.9 10*3/uL (ref 1.6–7.3)
Platelets: 195 10*3/uL (ref 140–440)
RBC: 3.96 x10e6/mcL (ref 3.60–5.20)
RDW: 14.1 % (ref 11.0–16.0)
WBC: 5.6 10*3/uL (ref 3.8–10.6)

## 2021-02-20 LAB — MICROSCOPIC URINALYSIS: MUCUS, URINE: NONE SEEN /LPF

## 2021-02-20 LAB — HEPATIC FUNCTION PANEL
ALT: 26 U/L (ref 0–35)
AST: 31 U/L (ref 0–35)
Albumin: 4.2 g/dL (ref 3.5–5.2)
Alk Phosphatase: 102 U/L (ref 35–117)
Bilirubin, Direct: <0.2 mg/dL (ref 0.00–0.30)
Total Bilirubin: 0.37 mg/dL (ref 0.00–1.20)
Total Protein: 7.2 g/dL (ref 6.4–8.3)

## 2021-02-20 LAB — COVID-19 & INFLUENZA COMBO
INFLUENZA A: NOT DETECTED
INFLUENZA B: NOT DETECTED
SARS-CoV-2: NOT DETECTED

## 2021-02-20 LAB — LIPASE: Lipase: 40 U/L (ref 13–60)

## 2021-02-20 NOTE — Discharge Summary (Signed)
ED Clinical Summary                     [facility]  [address] [place], [place], [address]  [phone]          PERSON INFORMATION  Name: Mia Roy, Mia Roy Age:  75 Years DOB: 19-Dec-1945   Sex: Female Language: English PCP: BLESSING-FEUSSNER,  [name]   Marital Status: Married Phone: [phone] Med Service: MED-Medicine   MRN: [id number] Acct# [id number] Arrival: 02/20/2021 17:12:00   Visit Reason: Abdominal pain; HA,FEVER,ABD PAIN Acuity: 3 LOS: 000 03:44   Address:    [address] [place] [facility] [address]   Diagnosis:    Diverticulitis  Medications:          New Medications  Printed Prescriptions  amoxicillin-clavulanate (Augmentin 875 mg-125 mg oral tablet) 1 Tabs Oral (given by mouth) 2 times a day for 10 Days. Refills: 0.  Last Dose:____________________  Medications that have not changed  Other Medications  hydrochlorothiazide 37.5 Milligram Oral (given by mouth) every day.  Last Dose:____________________  traZODone (traZODone 50 mg oral tablet) 1 Tabs Oral (given by mouth) Once a Day (at bedtime).  Last Dose:____________________  warfarin (warfarin 7.5 mg oral tablet) 1 Tabs Oral (given by mouth) every day. 10mg  1 day per week   7.5mg  6 days per week   for atrial fibrillation.  Last Dose:____________________      Medications Administered During Visit:                Medication Dose Route   Sodium Chloride 0.9% 1000 mL IV Piggyback               Allergies      Biaxin (Vomiting)      Major Tests and Procedures:  The following procedures and tests were performed during your ED visit.  COMMON PROCEDURES%>  COMMON PROCEDURES COMMENTS%>                PROVIDER INFORMATION               Provider Role Assigned L-PA ED MidLevel 02/20/2021 18:08:03    02/22/2021 J-RN ED Nurse 02/20/2021 18:15:32        Attending Physician:  02/22/2021 L-PA      Admit Doc  Annie Paras L-PA     Consulting Doc       VITALS INFORMATION  Vital  Sign Triage Latest   Temp Oral ORAL_1%> ORAL%>   Temp Temporal TEMPORAL_1%> TEMPORAL%>   Temp Intravascular INTRAVASCULAR_1%> INTRAVASCULAR%>   Temp Axillary AXILLARY_1%> AXILLARY%>   Temp Rectal RECTAL_1%> RECTAL%>   02 Sat 98 % 100 %   Respiratory Rate RATE_1%> RATE%>   Peripheral Pulse Rate PULSE RATE_1%> PULSE RATE%>   Apical Heart Rate HEART RATE_1%> HEART RATE%>   Blood Pressure BLOOD PRESSURE_1%>/ BLOOD PRESSURE_1%>90 mmHg BLOOD PRESSURE%> / BLOOD PRESSURE%>76 mmHg                 Immunizations      No Immunizations Documented This Visit          DISCHARGE INFORMATION   Discharge Disposition: [facility] Home   Discharge Location:  Home   Discharge Date and Time:  02/20/2021 20:56:25   ED Checkout Date and Time:  02/20/2021 20:56:25     DEPART REASON INCOMPLETE INFORMATION  Depart Action Incomplete Reason   Interactive View/I&O Recently assessed               Problems      No Problems Documented              Smoking Status      Never (less than 100 in lifetime)         PATIENT EDUCATION INFORMATION  Instructions:     Diverticulitis     Follow up:                   With: Address: When:   PCP or clinic  Within 1 week   Comments:   Follow up with Primary Care providerPlease take your entire course of antibiotics as prescribed. I would like you to follow up with your primary care provider to ensure that yor are getting better. Drink clear liquids and take OTC pain medications as needed. Return to ED if symptoms worsen              ED PROVIDER DOCUMENTATION

## 2021-02-20 NOTE — ED Notes (Signed)
ED Triage Note       ED Secondary Triage Entered On:  02/20/2021 17:33 EDT    Performed On:  02/20/2021 17:32 EDT by Fuller Song               General Information   Barriers to Learning :   None evident   COVID-19 Vaccine Status :   2 Doses received   ED Home Meds Section :   Document assessment   [facility] ED Fall Risk Section :   Document assessment   ED History Section :   Document assessment   ED Advance Directives Section :   Document assessment   ED Palliative Screen :   Document assessment   Fuller Song - 02/20/2021 17:32 EDT   (As Of: 02/20/2021 17:33:45 EDT)   Problems(Active)    Atrial fibrillation (SNOMED CT  :99242683 )  Name of Problem:   Atrial fibrillation ; Recorder:   Fuller Song; Confirmation:   Confirmed ; Classification:   Patient Stated ; Code:   [id number] ; Contributor System:   Dietitian ; Last Updated:   02/20/2021 17:31 EDT ; Life Cycle Date:   02/20/2021 ; Life Cycle Status:   Active ; Vocabulary:   SNOMED CT        Heart abnormality (SNOMED CT  :79892119 )  Name of Problem:   Heart abnormality ; Recorder:   Fuller Song; Confirmation:   Confirmed ; Classification:   Patient Stated ; Code:   [id number] ; Contributor System:   Dietitian ; Last Updated:   02/20/2021 17:28 EDT ; Life Cycle Date:   02/20/2021 ; Life Cycle Status:   Active ; Vocabulary:   SNOMED CT        Hypertension (SNOMED CT  :4818563149 )  Name of Problem:   Hypertension ; Recorder:   Fuller Song; Confirmation:   Confirmed ; Classification:   Patient Stated ; Code:   [id number] ; Contributor System:   Dietitian ; Last Updated:   02/20/2021 17:28 EDT ; Life Cycle Date:   02/20/2021 ; Life Cycle Status:   Active ; Vocabulary:   SNOMED CT        Muscular dystrophy (SNOMED CT  :502774128 )  Name of Problem:   Muscular dystrophy ; Recorder:   Fuller Song; Confirmation:   Confirmed ; Classification:   Patient Stated ; Code:   [id number] ; Contributor System:   PowerChart ; Last Updated:   02/20/2021 17:27 EDT ;  Life Cycle Date:   02/20/2021 ; Life Cycle Status:   Active ; Vocabulary:   SNOMED CT          Diagnoses(Active)    Abdominal pain  Date:   02/20/2021 ; Diagnosis Type:   Reason For Visit ; Confirmation:   Complaint of ; Clinical Dx:   Abdominal pain ; Classification:   Medical ; Clinical Service:   Emergency medicine ; Code:   PNED ; Probability:   0 ; Diagnosis Code:   [id number]             -    Procedure History   (As Of: 02/20/2021 17:33:45 EDT)     Anesthesia Minutes:   0 ; Procedure Name:   Pacemaker status ; Procedure Minutes:   0 ; Last Reviewed Dt/Tm:   02/20/2021 17:32:26 EDT            Anesthesia Minutes:   0 ; Procedure Name:   C section delivery ; Procedure Minutes:  0 ; Last Reviewed Dt/Tm:   02/20/2021 17:32:51 EDT            Anesthesia Minutes:   0 ; Procedure Name:   Tonsillectomy ; Procedure Minutes:   0 ; Last Reviewed Dt/Tm:   02/20/2021 17:33:05 EDT            Phoebe Perch Fall Risk Assessment Tool   Hx of falling last 3 months ED Fall :   No   Patient confused or disoriented ED Fall :   No   Fuller Song - 02/20/2021 17:32 EDT   ED Advance Directive   Advance Directive :   Yes   Fuller Song - 02/20/2021 17:32 EDT   Palliative Care   Does the Patient have a Life Limiting Illness :   None of the above   Fuller Song - 02/20/2021 17:32 EDT   Social History   Social History   (As Of: 02/20/2021 17:33:45 EDT)   Tobacco:        Tobacco use: Never (less than 100 in lifetime).   (Last Updated: 02/20/2021 17:33:17 EDT by Fuller Song)

## 2021-02-20 NOTE — ED Notes (Signed)
ED Triage Note       ED Triage Adult Entered On:  02/20/2021 17:32 EDT    Performed On:  02/20/2021 17:22 EDT by Fuller Song               Triage   Numeric Rating Pain Scale :   5 = Moderate pain   Chief Complaint :   abdominal pain began Sunday afternoon. headache, fever. Monday poor appetite all day. negative rapid covid test yesterday AM. Contacted PCP who told to come to ED for evaluation. Abd pain ongoing/ abdomen 'tender' per patient with abdomen feeling bloated.   [place] Mode of Arrival :   Private vehicle   Infectious Disease Documentation :   Document assessment   Temperature Oral :   36.9 degC(Converted to: 98.4 degF)    Heart Rate Monitored :   83 bpm   Respiratory Rate :   16 br/min   Systolic Blood Pressure :   173 mmHg (HI)    Diastolic Blood Pressure :   90 mmHg   SpO2 :   98 %   Oxygen Therapy :   Room air   Patient presentation :   None of the above   Chief Complaint or Presentation suggest infection :   Yes   Weight Dosing :   61 kg(Converted to: 134 lb 8 oz)    Height :   158 cm(Converted to: 5 ft 2 in)    Body Mass Index Dosing :   24 kg/m2   Fuller Song - 02/20/2021 17:22 EDT   DCP GENERIC CODE   Tracking Acuity :   3   Tracking Group :   ED [id number] Tracking Group   Fuller Song - 02/20/2021 17:22 EDT   ED General Section :   Document assessment   Pregnancy Status :   N/A   ED Allergies Section :   Document assessment   ED Reason for Visit Section :   Document assessment   ED Home Meds Section :   Document assessment   ED Quick Assessment :   Patient appears awake, alert, oriented to baseline. Skin warm and dry. Moves all extremities. Respiration even and unlabored. Appears in no apparent distress.   Fuller Song - 02/20/2021 17:22 EDT   ID Risk Screen Symptoms   Recent Travel History :   No recent travel   TB Symptom Screen :   No symptoms   Fuller Song - 02/20/2021 17:22 EDT   Allergies   (As Of: 02/20/2021 17:32:04 EDT)   Allergies (Active)   Biaxin  Estimated Onset Date:    Unspecified ; Reactions:   Vomiting ; Created By:   Fuller Song; Reaction Status:   Active ; Category:   Drug ; Substance:   Biaxin ; Type:   Allergy ; Severity:   Moderate ; Updated By:   Fuller Song; Reviewed Date:   02/20/2021 17:27 EDT        Psycho-Social   Last 3 mo, thoughts killing self/others :   Patient denies   Right click within box for Suspected Abuse policy link. :   None   Feels Safe Where Live :   Yes   ED Behavioral Activity Rating Scale :   4 - Quiet and awake (normal level of activity)   Fuller Song - 02/20/2021 17:22 EDT   ED Home Med List   Medication List   (As Of: 02/20/2021 17:32:04 EDT)  Home Meds    traZODone  :   traZODone ; Status:   Documented ; Ordered As Mnemonic:   traZODone 50 mg oral tablet ; Simple Display Line:   50 mg, 1 tabs, Oral, Once a Day (at bedtime), 0 Refill(s) ; Catalog Code:   traZODone ; Order Dt/Tm:   02/20/2021 17:30:18 EDT          hydrochlorothiazide  :   hydrochlorothiazide ; Status:   Documented ; Ordered As Mnemonic:   hydrochlorothiazide ; Simple Display Line:   37.5 mg, Oral, Daily, 0 Refill(s) ; Catalog Code:   hydrochlorothiazide ; Order Dt/Tm:   02/20/2021 17:29:01 EDT          warfarin  :   warfarin ; Status:   Documented ; Ordered As Mnemonic:   warfarin 7.5 mg oral tablet ; Simple Display Line:   7.5 mg, 1 tabs, Oral, Daily, 10mg  1 day per week  7.5mg  6 days per week  for atrial fibrillation, 0 Refill(s) ; Catalog Code:   warfarin ; Order Dt/Tm:   02/20/2021 17:29:30 EDT            ED Reason for Visit   (As Of: 02/20/2021 17:32:04 EDT)   Problems(Active)    Atrial fibrillation (SNOMED CT  :02/22/2021 )  Name of Problem:   Atrial fibrillation ; Recorder:   [id number]; Confirmation:   Confirmed ; Classification:   Patient Stated ; Code:   Fuller Song ; Contributor System:   [id number] ; Last Updated:   02/20/2021 17:31 EDT ; Life Cycle Date:   02/20/2021 ; Life Cycle Status:   Active ; Vocabulary:   SNOMED CT        Heart abnormality (SNOMED  CT  :02/22/2021 )  Name of Problem:   Heart abnormality ; Recorder:   [id number]; Confirmation:   Confirmed ; Classification:   Patient Stated ; Code:   Fuller Song ; Contributor System:   [id number] ; Last Updated:   02/20/2021 17:28 EDT ; Life Cycle Date:   02/20/2021 ; Life Cycle Status:   Active ; Vocabulary:   SNOMED CT        Hypertension (SNOMED CT  :02/22/2021 )  Name of Problem:   Hypertension ; Recorder:   [id number]; Confirmation:   Confirmed ; Classification:   Patient Stated ; Code:   Fuller Song ; Contributor System:   [id number] ; Last Updated:   02/20/2021 17:28 EDT ; Life Cycle Date:   02/20/2021 ; Life Cycle Status:   Active ; Vocabulary:   SNOMED CT        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 )  Name of Problem:   Muscular dystrophy ; Recorder:   [id number]; Confirmation:   Confirmed ; Classification:   Patient Stated ; Code:   Fuller Song ; Contributor System:   [id number] ; Last Updated:   02/20/2021 17:27 EDT ; Life Cycle Date:   02/20/2021 ; Life Cycle Status:   Active ; Vocabulary:   SNOMED CT          Diagnoses(Active)    Abdominal pain  Date:   02/20/2021 ; Diagnosis Type:   Reason For Visit ; Confirmation:   Complaint of ; Clinical Dx:   Abdominal pain ; Classification:   Medical ; Clinical Service:   Emergency medicine ; Code:   PNED ; Probability:   0 ; Diagnosis Code:   [id number]

## 2021-02-20 NOTE — ED Notes (Signed)
ED Patient Education Note     Patient Education Materials Follows:  Gastroenterology     Diverticulitis      Diverticulitis is infection or inflammation of small pouches (diverticula) in the colon that form due to a condition called diverticulosis. Diverticula can trap stool (feces) and bacteria, causing infection and inflammation.    Diverticulitis may cause severe stomach pain and diarrhea. It may lead to tissue damage in the colon that causes bleeding or blockage. The diverticula may also burst (rupture) and cause infected stool to enter other areas of the abdomen.      What are the causes?    This condition is caused by stool becoming trapped in the diverticula, which allows bacteria to grow in the diverticula. This leads to inflammation and infection.      What increases the risk?    You are more likely to develop this condition if you have diverticulosis. The risk increases if you:   Are overweight or obese.     Do not get enough exercise.     Drink alcohol.     Use tobacco products.     Eat a diet that has a lot of red meat such as beef, pork, or lamb.     Eat a diet that does not include enough fiber. High-fiber foods include fruits, vegetables, beans, nuts, and whole grains.     Are over 40 years of age.        What are the signs or symptoms?    Symptoms of this condition may include:   Pain and tenderness in the abdomen. The pain is normally located on the left side of the abdomen, but it may occur in other areas.     Fever and chills.     Nausea.     Vomiting.     Cramping.     Bloating.     Changes in bowel routines.     Blood in your stool.        How is this diagnosed?    This condition is diagnosed based on:   Your medical history.     A physical exam.     Tests to make sure there is nothing else causing your condition. These tests may include:  ? Blood tests.    ? Urine tests.    ? CT scan of the abdomen.          How is this treated?    Most cases of this condition are mild and can be treated at  home. Treatment may include:   Taking over-the-counter pain medicines.     Following a clear liquid diet.     Taking antibiotic medicines by mouth.     Resting.      More severe cases may need to be treated at a hospital. Treatment may include:   Not eating or drinking.     Taking prescription pain medicine.     Receiving antibiotic medicines through an IV.     Receiving fluids and nutrition through an IV.     Surgery.      When your condition is under control, your health care provider may recommend that you have a colonoscopy. This is an exam to look at the entire large intestine. During the exam, a lubricated, bendable tube is inserted into the anus and then passed into the rectum, colon, and other parts of the large intestine. A colonoscopy can show how severe your diverticula are and whether something else may   be causing your symptoms.      Follow these instructions at home:    Medicines     Take over-the-counter and prescription medicines only as told by your health care provider. These include fiber supplements, probiotics, and stool softeners.     If you were prescribed an antibiotic medicine, take it as told by your health care provider. Do not stop taking the antibiotic even if you start to feel better.     Ask your health care provider if the medicine prescribed to you requires you to avoid driving or using machinery.      Eating and drinking       Follow a full liquid diet or another diet as directed by your health care provider.     After your symptoms improve, your health care provider may tell you to change your diet. He or she may recommend that you eat a diet that contains at least 25 grams (25 g) of fiber daily. Fiber makes it easier to pass stool. Healthy sources of fiber include:  ? Berries. One cup contains 4?8 grams of fiber.    ? Beans or lentils. One-half cup contains 5?8 grams of fiber.    ? Green vegetables. One cup contains 4 grams of fiber.       Avoid eating red meat.      General  instructions     Do not use any products that contain nicotine or tobacco, such as cigarettes, e-cigarettes, and chewing tobacco. If you need help quitting, ask your health care provider.     Exercise for at least 30 minutes, 3 times each week. You should exercise hard enough to raise your heart rate and break a sweat.     Keep all follow-up visits as told by your health care provider. This is important. You may need to have a colonoscopy.        Contact a health care provider if:     Your pain does not improve.     Your bowel movements do not return to normal.      Get help right away if:     Your pain gets worse.     Your symptoms do not get better with treatment.     Your symptoms suddenly get worse.     You have a fever.     You vomit more than one time.     You have stools that are bloody, black, or tarry.      Summary     Diverticulitis is infection or inflammation of small pouches (diverticula) in the colon that form due to a condition called diverticulosis. Diverticula can trap stool (feces) and bacteria, causing infection and inflammation.     You are at higher risk for this condition if you have diverticulosis and you eat a diet that does not include enough fiber.     Most cases of this condition are mild and can be treated at home. More severe cases may need to be treated at a hospital.     When your condition is under control, your health care provider may recommend that you have an exam called a colonoscopy. This exam can show how severe your diverticula are and whether something else may be causing your symptoms.     Keep all follow-up visits as told by your health care provider. This is important.      This information is not intended to replace advice given to you by your health care provider.   Make sure you discuss any questions you have with your health care provider.      Document Revised: 03/22/2019 Document Reviewed: 03/22/2019  Elsevier Patient Education ? 2021 Elsevier Inc.

## 2021-02-20 NOTE — ED Notes (Signed)
 ED Patient Summary       ;       [facility] Emergency Department  [address] [place], [place] [address]  [phone]  Discharge Instructions (Patient)  Name:Mia Roy, Mia Roy  DOB:  [birth year]                   MRN: [id number]                   FIN: [id number]  Reason For Visit: Abdominal pain; HA,FEVER,ABD PAIN  Final Diagnosis: Diverticulitis     Visit Date: 02/20/2021 17:12:00  Address: [address] [place] [address]  Phone: [phone]     Emergency Department Providers:         Primary Physician:      DRUCIE WILBERT CROME      [facility] would like to thank you for allowing us  to assist you with your healthcare needs. The following includes patient education materials and information regarding your injury/illness.     Follow-up Instructions:  You were seen today on an emergency basis. Please contact your primary care doctor for a follow up appointment. If you received a referral to a specialist doctor, it is important you follow-up as instructed.    It is important that you call your follow-up doctor to schedule and confirm the location of your next appointment. Your doctor may practice at multiple locations. The office location of your follow-up appointment may be different to the one written on your discharge instructions.    If you do not have a primary care doctor, please call [phone] for help in finding a [name] Provider. For help in finding a specialist doctor, please call [phone].    If your condition gets worse before your follow-up with your primary care doctor or specialist, please return to the Emergency Department.      Coronavirus 2019 (COVID-19) Reminders:     Patients age 40 - 16, with parental consent, and patients over age 52 can make an appointment for a COVID-19 vaccine. Patients can contact their [facility] doctors' offices to schedule an appointment to receive the COVID-19  vaccine. Patients who do not have a [facility] physician can call [phone] to schedule vaccination appointments.      Follow Up Appointments:  Primary Care Provider:     Name: BLESSING-FEUSSNER,  [name]     Phone: [phone]                 With: Address: When:   PCP or clinic  Within 1 week   Comments:   Follow up with Primary Care providerPlease take your entire course of antibiotics as prescribed. I would like you to follow up with your primary care provider to ensure that yor are getting better. Drink clear liquids and take OTC pain medications as needed. Return to ED if symptoms worsen              [facility] SERVICES%>          New Medications  Printed Prescriptions  amoxicillin-clavulanate (Augmentin 875 mg-125 mg oral tablet) 1 Tabs Oral (given by mouth) 2 times a day for 10 Days. Refills: 0.  Last Dose:____________________  Medications that have not changed  Other Medications  hydrochlorothiazide 37.5 Milligram Oral (given by mouth) every day.  Last Dose:____________________  traZODone (traZODone 50 mg oral tablet) 1 Tabs Oral (  given by mouth) Once a Day (at bedtime).  Last Dose:____________________  warfarin (warfarin 7.5 mg oral tablet) 1 Tabs Oral (given by mouth) every day. 10mg  1 day per week   7.5mg  6 days per week   for atrial fibrillation.  Last Dose:____________________      Allergy Info: Biaxin     >Discharge Additional Information          Discharge Patient 02/20/21 20:39:00 EDT      Patient Education Materials:        Diverticulitis      Diverticulitis is infection or inflammation of small pouches (diverticula) in the colon that form due to a condition called diverticulosis. Diverticula can trap stool (feces) and bacteria, causing infection and inflammation.    Diverticulitis may cause severe stomach pain and diarrhea. It may lead to tissue damage in the colon that causes bleeding or blockage. The diverticula may also burst (rupture) and cause infected  stool to enter other areas of the abdomen.      What are the causes?    This condition is caused by stool becoming trapped in the diverticula, which allows bacteria to grow in the diverticula. This leads to inflammation and infection.      What increases the risk?    You are more likely to develop this condition if you have diverticulosis. The risk increases if you:   Are overweight or obese.     Do not get enough exercise.     Drink alcohol.     Use tobacco products.     Eat a diet that has a lot of red meat such as beef, pork, or lamb.     Eat a diet that does not include enough fiber. High-fiber foods include fruits, vegetables, beans, nuts, and whole grains.     Are over 80 years of age.        What are the signs or symptoms?    Symptoms of this condition may include:   Pain and tenderness in the abdomen. The pain is normally located on the left side of the abdomen, but it may occur in other areas.     Fever and chills.     Nausea.     Vomiting.     Cramping.     Bloating.     Changes in bowel routines.     Blood in your stool.        How is this diagnosed?    This condition is diagnosed based on:   Your medical history.     A physical exam.     Tests to make sure there is nothing else causing your condition. These tests may include:  ? Blood tests.    ? Urine tests.    ? CT scan of the abdomen.          How is this treated?    Most cases of this condition are mild and can be treated at home. Treatment may include:   Taking over-the-counter pain medicines.     Following a clear liquid diet.     Taking antibiotic medicines by mouth.     Resting.      More severe cases may need to be treated at a hospital. Treatment may include:   Not eating or drinking.     Taking prescription pain medicine.     Receiving antibiotic medicines through an IV.     Receiving fluids and nutrition through an IV.     Surgery.  When your condition is under control, your health care provider may recommend that you have a colonoscopy.  This is an exam to look at the entire large intestine. During the exam, a lubricated, bendable tube is inserted into the anus and then passed into the rectum, colon, and other parts of the large intestine. A colonoscopy can show how severe your diverticula are and whether something else may be causing your symptoms.      Follow these instructions at home:    Medicines     Take over-the-counter and prescription medicines only as told by your health care provider. These include fiber supplements, probiotics, and stool softeners.     If you were prescribed an antibiotic medicine, take it as told by your health care provider. Do not stop taking the antibiotic even if you start to feel better.     Ask your health care provider if the medicine prescribed to you requires you to avoid driving or using machinery.      Eating and drinking       Follow a full liquid diet or another diet as directed by your health care provider.     After your symptoms improve, your health care provider may tell you to change your diet. He or she may recommend that you eat a diet that contains at least 25 grams (25 g) of fiber daily. Fiber makes it easier to pass stool. Healthy sources of fiber include:  ? Berries. One cup contains 4?8 grams of fiber.    ? Beans or lentils. One-half cup contains 5?8 grams of fiber.    ? Green vegetables. One cup contains 4 grams of fiber.       Avoid eating red meat.      General instructions     Do not use any products that contain nicotine or tobacco, such as cigarettes, e-cigarettes, and chewing tobacco. If you need help quitting, ask your health care provider.     Exercise for at least 30 minutes, 3 times each week. You should exercise hard enough to raise your heart rate and break a sweat.     Keep all follow-up visits as told by your health care provider. This is important. You may need to have a colonoscopy.        Contact a health care provider if:     Your pain does not improve.     Your bowel  movements do not return to normal.      Get help right away if:     Your pain gets worse.     Your symptoms do not get better with treatment.     Your symptoms suddenly get worse.     You have a fever.     You vomit more than one time.     You have stools that are bloody, black, or tarry.      Summary     Diverticulitis is infection or inflammation of small pouches (diverticula) in the colon that form due to a condition called diverticulosis. Diverticula can trap stool (feces) and bacteria, causing infection and inflammation.     You are at higher risk for this condition if you have diverticulosis and you eat a diet that does not include enough fiber.     Most cases of this condition are mild and can be treated at home. More severe cases may need to be treated at a hospital.     When your condition is under control, your health  care provider may recommend that you have an exam called a colonoscopy. This exam can show how severe your diverticula are and whether something else may be causing your symptoms.     Keep all follow-up visits as told by your health care provider. This is important.      This information is not intended to replace advice given to you by your health care provider. Make sure you discuss any questions you have with your health care provider.      Document Revised: 03/22/2019 Document Reviewed: 03/22/2019  Elsevier Patient Education ? 2021 Elsevier Inc.      ---------------------------------------------------------------------------------------------------------------------  [facility] allows patients to review your COVID and other test results as well as discharge documents from any [name], Emergency Department, surgical [facility] or outpatient lab. Test results are typically available 36 hours after the test is completed.     [facility] encourages you to self-enroll in the [facility] Patient Portal.     To begin your self-enrollment  process, please visit [email or web address] Under [facility], click on "Sign up now".     NOTE: You must be 16 years and older to use [facility] Self-Enroll online. If you are a parent, caregiver, or guardian; you need an invite to access your child's or dependent's health records. To obtain an invite, contact the Medical Records department at [phone] Monday through Friday, 8-4:30, select option 3 . If we receive your call afterhours, we will return your call the next business day.     If you have issues trying to create or access your account, contact [employer] support at [phone] available 7 days a week 24 hours a day.     Comment:

## 2021-02-20 NOTE — ED Provider Notes (Signed)
Abdominal Pain *ED        Patient:   Mia Roy, Mia Roy             MRN: [id number]            FIN: [id number]               Age:   75 years     Sex:  Female     DOB:  1945/08/01   Associated Diagnoses:   Diverticulitis; Upper abdominal pain, unspecified   Author:   Annie Paras L-PA      Basic Information   Time seen: Provider Seen (ST)   ED Provider/Time:    Tyrica Afzal,  Venda Dice L-PA / 02/20/2021 18:08  .   Additional information: Chief Complaint from Nursing Triage Note   Chief Complaint  Chief Complaint: abdominal pain began Sunday afternoon. headache, fever. Monday poor appetite all day. negative rapid covid test yesterday AM. Contacted PCP who told to come to ED for evaluation. Abd pain ongoing/ abdomen 'tender' per patient with abdomen feeling bloated. (02/20/21 17:22:00).      History of Present Illness   The patient presents with Abdominal pain, fever, body aches which began 2 nights ago.  Patient states that she originally began to feel body aches and chills and like she was sore and then her head began to hurt afterwards.  She now is describing generalized malaise and is also having some discomfort to her abdomen area.  She says it does not hurt more in any area but it just feels uncomfortable.  She has had no nausea and no vomiting and no diarrhea.  her highest temperature at home was 99.9 degrees.  Take a test for COVID which was negative.  She denies urinary symptoms.  She is not having any diarrhea.  Her bowel movements have been regular and normal without blood in them.  She has no history of abdominal surgeries aside from a C-section many years ago.  She has not had any upper respiratory symptoms and has not taken any medications for this illness.  SHe denies chest pain, shortness of breath, and radiation of pain to her chest area.  She has a history of a fib in the past. .  The onset was 2  days ago.  The course/duration of symptoms is constant.  The character of symptoms is achy.  The degree at onset  was moderate.  The Location of pain at onset was diffuse.  The degree at present is moderate.  The Location of pain at present is upper.  Radiating pain: none. There are exacerbating factors including changing position and movement.  The relieving factor is none.  Therapy today: none.  Risk factors consist of hypertension and a fib.  Associated symptoms: fever, chills, headache, denies chest pain, denies nausea, denies vomiting, denies diarrhea, denies back pain, denies shortness of breath and denies dizziness.        Review of Systems   Constitutional symptoms:  Fever, chills.    Skin symptoms:  No rash,    ENMT symptoms:  No ear pain, no sore throat, no nasal congestion.    Respiratory symptoms:  No cough,    Cardiovascular symptoms:  No chest pain,    Gastrointestinal symptoms:  Abdominal pain, nausea, no vomiting, no diarrhea, no constipation.    Genitourinary symptoms:  No dysuria, no hematuria.    Musculoskeletal symptoms:  No back pain, no Muscle pain.    Neurologic symptoms:  No headache,  Health Status   Allergies:    Allergic Reactions (Selected)  Moderate  Biaxin- Vomiting..   Medications:  (Selected)   Inpatient Medications  Ordered  Sodium Chloride 0.9% bolus: 1,000 mL, 1000 mL/hr, IV Piggyback, Once  Documented Medications  Documented  hydrochlorothiazide: 37.5 mg, Oral, Daily, 0 Refill(s)  traZODone 50 mg oral tablet: 50 mg, 1 tabs, Oral, Once a Day (at bedtime), 0 Refill(s)  warfarin 7.5 mg oral tablet: 7.5 mg, 1 tabs, Oral, Daily, 10mg  1 day per week  7.5mg  6 days per week  for atrial fibrillation, 0 Refill(s).      Past Medical/ Family/ Social History   Medical history: Reviewed as documented in chart.   Surgical history: Reviewed as documented in chart.   Family history: Not significant.   Social history: Reviewed as documented in chart.   Problem list:    Active Problems (4)  Atrial fibrillation   Heart abnormality   Hypertension   Muscular dystrophy   .      Physical Examination                Vital Signs   Vital Signs   02/20/2021 18:15 EDT Systolic Blood Pressure 163 mmHg  HI    Diastolic Blood Pressure 76 mmHg    Temperature Oral 36.8 degC    Heart Rate Monitored 79 bpm    Respiratory Rate 16 br/min    SpO2 100 %   02/20/2021 17:22 EDT Systolic Blood Pressure 173 mmHg  HI    Diastolic Blood Pressure 90 mmHg    Temperature Oral 36.9 degC    Heart Rate Monitored 83 bpm    Respiratory Rate 16 br/min    SpO2 98 %   .   Measurements   02/20/2021 17:32 EDT Body Mass Index est meas 24.44 kg/m2    Body Mass Index Measured 24.44 kg/m2   02/20/2021 17:22 EDT Height/Length Measured 158 cm    Weight Dosing 61 kg   .   Basic Oxygen Information   02/20/2021 18:15 EDT Oxygen Therapy Room air    SpO2 100 %   02/20/2021 17:22 EDT Oxygen Therapy Room air    SpO2 98 %   .   General:  Alert, no acute distress.    Skin:  Warm, dry, pink.    Head:  Normocephalic.   Neck:  Supple, trachea midline.    Eye:  Pupils are equal, round and reactive to light, extraocular movements are intact.    Ears, nose, mouth and throat:  Oral mucosa moist.   Cardiovascular:  Regular rate and rhythm.   Respiratory:  Lungs are clear to auscultation, respirations are non-labored.    Back:  Normal range of motion, No CVA tenderness.    Musculoskeletal:  Normal ROM, no tenderness.    Chest wall   Gastrointestinal:  Soft, Non distended, Normal bowel sounds, No organomegaly, Acutely tender to palpation throughout all quadrants.  More pronounced tenderness palpation of right lower quadrant area but also has tenderness in suprapubic area and left lower quadrant as well.  She has no rebound or guarding..    Neurological:  Alert and oriented to person, place, time, and situation, No focal neurological deficit observed, normal sensory observed, normal motor observed.    Lymphatics:  No lymphadenopathy.   Psychiatric:  Cooperative, appropriate mood & affect.       Medical Decision Making   Rationale:  PA/NP reviewed with co-signing physician: diagnosis and  plan of care.   Documents reviewed:  Emergency department nurses' notes.   Radiology results:  Rad Results (ST)   CT Abdomen Pelvis w/o Contrast  ?  02/20/21 20:17:36  Indication: Abdominal pain, acute, nonlocalized    Comparison: None    Technique: Routine renal stone protocol CT of the abdomen and pelvis without  contrast. CT scanning was performed using radiation dose reduction techniques  when appropriate, per system protocols.    Findings in the visualized chest:  Lungs: No focal parenchymal opacity within the visualized lungs.  Pleura: No pleural effusion.  Heart: Unremarkable, no pericardial effusion.  Esophagus: Unremarkable  Visualized breasts: Unremarkable    Findings in the abdomen:  LACK OF ORAL AND IV CONTRAST LIMITS SENSITIVITY FOR SOLID AND HOLLOW ORGAN  LESION DETECTION.  Stomach: Visualized portion unremarkable.  Liver: Visualized portion unremarkable.  Gallbladder: Morphologically normal.  Spleen: Visualized portion unremarkable.  Pancreas: Visualized portion unremarkable.  Adrenals: Morphologically normal.  Kidneys: No obvious mass lesions, hydronephrosis or nephrolithiasis.  Ureters: Nondilated.  Small bowel: Morphologically normal.  Appendix: Normal  Colon: Moderate diverticulosis. Moderate stranding surrounding the mid sigmoid  colon.  Peritoneum: No ascites or pneumoperitoneum.  Lymph nodes: There are no pathologically enlarged mesenteric, retroperitoneal or  porta hepatis lymph nodes.  Vessels: Unremarkable. No aneurysm.    Findings in the pelvis:  Urinary bladder: Normal  Adnexa: Morphologically normal.  Uterus: Unremarkable.    Skeleton: No suspicious lytic or blastic lesions.    Impression:  Findings likely reflect mild sigmoid diverticulitis.      If you have questions regarding this report, please feel free to call me  directly using [employer].  ?  Signed By: Jari FavreBENNETT, JEFFERY-MD  .   Notes:  Lab results reassuring today.  CT scan with evidence of possible diverticulitis.  Will treat with  antibiotics p.o. is no evidence of abscess or other complications, patient well-appearing in ED, with stable vital signs throughout her visit with us.  I advised and recommend that she do follow-up with her primary care provider to ensure that she completely recovers.  Discussedclear liquids and bowel rest.  she does have a primary care provider for which she is established with.  Into the ED for any worsening or changing symptoms to include worsening abdominal pain, fever development, blood in stoool or mucous, inability to tolerate PO, dehydration, changing symptoms. .      Impression and Plan   Diagnosis   Diverticulitis (ICD10-CM K57.92, Discharge, Medical)   Plan   Condition: Stable.    Disposition: Discharged: to home.    Prescriptions: Launch prescriptions   Pharmacy:  Augmentin 875 mg-125 mg oral tablet (Prescribe): 1 tabs, Oral, BID, for 10 days, 20 tabs, 0 Refill(s).    Patient was given the following educational materials: Diverticulitis, Diverticulitis.    Follow up with: PCP or clinic Within 1 week Follow up with Primary Care providerPlease take your entire course of antibiotics as prescribed.  I would like you to follow up with your primary care provider to ensure that yor are getting better. Drink clear liquids and take OTC pain medications as needed. Return to ED if symptoms worsen.    Counseled: Patient, Family.    Signature Line     Electronically Signed on 02/20/2021 10:03 PM EDT   ________________________________________________   Annie ParasARLING,  Zoria Rawlinson L-PA      Electronically Signed on 02/20/2021 10:07 PM EDT   ________________________________________________   Gordy CouncilmanALLINDER,  RUSSELL S-MD            Modified by: Annie ParasARLING,  Haddie Bruhl  L-PA on 02/20/2021 06:26 PM EDT      Modified by: Annie Paras L-PA on 02/20/2021 06:27 PM EDT      Modified by: Piedad Climes,  Bernece Gall L-PA on 02/20/2021 08:16 PM EDT      Modified by: Piedad Climes,  Tyton Abdallah L-PA on 02/20/2021 08:20 PM EDT      Modified by: Piedad Climes,  Rayel Santizo L-PA  on 02/20/2021 08:47 PM EDT      Modified by: Piedad Climes,  Gabi Mcfate L-PA on 02/20/2021 09:57 PM EDT      Modified by: Piedad Climes,  Eunie Lawn L-PA on 02/20/2021 10:02 PM EDT      Modified by: Piedad Climes,  Evone Arseneau L-PA on 02/20/2021 10:03 PM EDT

## 2021-02-21 LAB — CULTURE, URINE: FINAL REPORT: 15000

## 2023-08-19 ENCOUNTER — Ambulatory Visit: Admit: 2023-08-19 | Discharge: 2023-08-19 | Payer: PRIVATE HEALTH INSURANCE | Primary: Physician Assistant

## 2023-08-19 ENCOUNTER — Ambulatory Visit
Admit: 2023-08-19 | Discharge: 2023-08-19 | Payer: PRIVATE HEALTH INSURANCE | Attending: Orthopaedic Surgery | Primary: Physician Assistant

## 2023-08-19 DIAGNOSIS — G8929 Other chronic pain: Secondary | ICD-10-CM

## 2023-08-19 NOTE — Progress Notes (Signed)
 Chief Complaint   Patient presents with    Knee Pain     Right knee pain       HPI     Mia Roy 03/31/1946 is a  78 y.o. Mar[place] Kitchenfemale       Mia Roy is a 78 y.o. female h/o MD and pacemaker, here for right knee pain which began in 2013 aftr being [place] with increased walking and pain on the lateral and inferior patella aspect of the knee. Pain is described as achy increasing with sit to stand. Patient has tried Voltaren, Advil, Tylenol, heat with relief. No prior injections or surgeries to the right knee.      Of note, patient reports she has fallen 3 times since November. Once at her daughter's house ho is ding renovations and she missed the last step. In February visiting her mother in law she leaned on the walker in the room and it rolled out from under her and she fell on her knee. Two weeks ago she was at her husband for a ruptured appendix and she was exhausted, stressed and passed out when she went to stand up. 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 She is unsure how she fell at the time.    She is headed back [place] at the end of March and wanted to have her knee examined to be sure that there was nothing she should avoid.     Medications / Allergies     No current outpatient medications on file prior to visit.     No current facility-administered medications on file prior to visit.        Allergies   Allergen Reactions    Clarithromycin      Other reaction(s): vomiting        PMH / PSH / SH/ FH     History reviewed. No pertinent past medical history.     History reviewed. No pertinent surgical history.     Social History     Occupational History    Not on file   Tobacco Use    Smoking status: Not on file    Smokeless tobacco: Not on file   Substance and Sexual Activity    Alcohol use: Not on file    Drug use: Not on file    Sexual activity: Not on file        History reviewed. No pertinent family history.      Physical Exam     Right Knee Exam     Muscle Strength   The patient has normal right knee strength.    Tenderness   The patient is experiencing no tenderness.     Range of Motion   The patient has normal right knee ROM.    Tests   McMurray:  Medial - negative   Varus: negative Valgus: negative  Lachman:  Anterior - negative      Drawer:  Anterior - negative    Posterior - negative    Other   Erythema: absent  Sensation: normal  Pulse: present  Swelling: mild                Imaging     XR KNEE RIGHT (MIN 4 VIEWS)    Result Date: 08/19/2023  4 view standing right knee preserved joint space normal alignment Co. calcinosis subtly noted medial lateral meniscus       Assessment  1. Chronic pain of right knee  -     XR KNEE RIGHT (MIN 4 VIEWS); Future      Plan     Right knee pain after injury. Initially had post lat pain consistent with hamstring strain that has resolved. Some mild suprapatellar swelling. Discussed ice, knee sleeve while on her trip. Uses prn voltaren gel    Return if symptoms worsen or fail to improve.       This document was created using voice recognition software so mistakes are possible. For any concerns about the wording of this document, please contact its creator for further clarification.

## 2024-03-30 NOTE — Progress Notes (Signed)
 "  Subjective:   [facility] today for an INR follow-up. Patient is without complaints regarding her current warfarin regimen.      Interim hx: pt woke up with hearing loss several months ago, still hasn't resolved and continues seeing MD for further evaluation, hearing aid did not work.  - 9/15: patient recently returned from vacation/trip, patient denies any medication changes, may have some diet changes  - 9/25: patient reports concerns for new bruising that is unusual for her, 2 on arm and one on her chest. Today, bruising looks like its healing well and patient denies any bleeding     Indication: atrial fibrillation      CHADS2-VASc  CALCULATOR   Risk Factor Points   Age 88-74 (+42)    Sex  Female (+1)    CHF No (+0)    HTN Yes (+1)    CVA/TIA/VTE Yes (+2)    Vascular disease No (+0)    Diabetes No (+0)    Total Score:  5 (6.7% yearly stroke risk) - for high risk patients, recommend oral anticoagulant       HAS-BLED CALCULATOR     Risk Factor Points   HTN   (uncontrolled > 160 mmHg systolic) No (+0)    Renal disease   (dialysis, transplant, Cr > 2.26) No (+0)    Liver disease   (cirrhosis or bilirubin > 2x normal with AST/ALT/ALP > 3x normal) No (+0)    Stroke history Yes (+1)    Prior major bleeding or predisposition to bleeding No (+0)    Labile INR   (TTR < 60%) No (+0)    Age > 65 Yes (+1)    Medication usage predisposition to bleeding   (aspirin, clopidogrel, NSAIDs) Yes (+1)    Alcohol use   (> 7 drinks per week) No (+0)    Total Score:  3 (5.8% risk) - high risk for major bleeding       Current dose: 7.5mg  daily  Tablet strength: 5 mg peach   Total Weekly Dose: 52.5 mg                Date therapy initiated: 2008 (patient was previously on Coumadin from 2001-2007)  Duration of therapy: Lifelong  Risk Factor-Bleeding: ASA 81 mg, omega-3 fatty acid fish oil  Risk Factor-Thrombosis: Previous TIA  Bleeding signs/symptoms: denies  Thromboembolic signs/symptoms: denies  Missed  doses: no  Medication changes: no  Dietary changes:no  Alcohol Use: no   Bacterial/viral infection: no  Upcoming Surgical and Dental Procedures: none  Need for refill of warfarin: no   Other concerns:no  Ambulatory Referral Date: 03/20/23  Referring Provider: Dr. Celestia        Objective:       INR Today: 2.5     Date INR Warfarin Dose  Plan   06/27/20 3.0 10 mg M, 7.5 mg AOD CCD   07/27/20 1.9 10 mg M, 7.5 mg AOD 10mg  x1 then CCD   08/24/20 2.5 10mg  M, 7.5mg  AOD CCD   10/02/20 2.5 10mg  M, 7.5mg  AOD  CCD   11/06/20 2.9 10mg  M, 7.5mg  AOD  CCD   11/28/20 2.6 10mg  M, 7.5mg  AOD CCD   01/02/21 2.8 10mg  M, 7.5mg  AOD CCD   02/05/21 3.6 10mg  M, 7.5mg  AOD 5mg  x1, then CCD   03/06/21 4.5 10mg  M, 7.5mg  AOD Skip x1 then 7.5mg  daily   03/13/21 3.1 7.5mg  daily CCD   03/19/21 4.0 7.5mg  daily 2.5mg x1, 5mg   Tues then CCD   03/26/21 2.6 7.5mg  daily 5mg  W, 7.5mg  AOD   04/02/21 1.9 5mg  W, 7.5 mg AOD 10mg  M, 7.5mg  AOD   04/23/21 2.9 10mg  M, 7.5mg  AOD CCD   05/28/21 3.7 10mg  M, 7.5mg  AOD 5mg x1 then CCD   06/11/21 2.3 10mg  M, 7.5mg  AOD CCD   07/10/21 1.9 10mg  M, 7.5mg  AOD 10mg x1 then CCD   07/24/21 2.8 10mg  M, 7.5mg  AOD CCD   08/23/21 3.0 10mg  M, 7.5mg  AOD CCD   09/25/21 3.1 10mg  M, 7.5mg  AOD CCD   10/30/21 4.2 10mg  M, 7.5mg  AOD 5mg x2, then 7.5 mg daily   11/06/21 3.4 7.5mg  daily 5mg  M, 7.5mg  AOD   11/20/21 2.2 5mg  M, 7.5mg  AOD 10mg x1 then CCD   12/11/21 3.7 5mg  M, 7.5mg  AOD 5mg  x1, then CCD   12/31/21 3.6 5mg  M, 7.5mg  AOD 5mg  MF, 7.5mg  AOD   01/29/22 2.3 5mg  MF, 7.5mg  AOD 5mg  M, 7.5mg  AOD   02/19/22 2.2 5mg  M, 7.5mg  AOD CCD   03/19/22 2.7 5mg  M, 7.5mg  AOD CCD   04/22/22 4.6 5mg  M, 7.5mg  AOD Skip today then CCD   04/29/22 2.4 5mg  M, 7.5mg  AOD CCD   06/03/22 2.5 5mg  M, 7.5mg  AOD CCD   06/27/22 2.4 5mg  M, 7.5mg  AOD CCD   08/05/22 2.5 5mg  M, 7.5mg  AOD CCD   09/03/22 1.42 5mg  M, 7.5mg  AOD 10mg x2 then 7.5mg  daily   09/09/22 1.8 7.5mg  daily 10mg  MF, 7.5mg  AOD   09/26/22 2.7 10mg  MF, 7.5mg  AOD CCD   10/24/22 3.9 10mg  MF, 7.5mg  AOD 2.5mg x1, 7.5mg F then CCD   11/12/22 3.8 7.5mg  daily 2.5mg x1,  7.5mg  daily   11/21/22 3.3 7.5mg  daily 5mg x1 then CCD   11/28/22 2.5 7.5mg  daily CCD   12/23/22 3.8 7.5mg  daily 5mg x1, 7.5mg  daily   01/13/23 3.2 7.5mg  daily CCD   02/20/23 2.1 7.5mg  daily CCD   03/20/23 2.5 7.5mg  daily CCD   04/17/23 3.9 7.5mg  daily 5mg  today then CCD   05/01/23 2.5 7.5mg  daily CCD   06/02/23 2.8 7.5mg  daily CCD   06/25/22 2.7 7.5mg  daily CCD   07/31/23 2.4 7.5mg  daily CCD   09/01/23 2.3 7.5mg  daily CCD   10/06/23 3.3 7.5mg  daily CCD   10/20/23 2.7 7.5mg  daily CCD   11/18/23 2.9 7.5mg  daily CCD   12/16/23 2.7 7.5mg  daily CCD   01/19/24 1.8 7.5mg  daily 10mg x1 then CCD   02/02/24 3.0 7.5mg  daily CCD   03/08/24 3.5 7.5mg  daily 5 mg x 1 then CCD   03/18/24 2.5 7.5 mg daily CCD   03/30/24 2.5 7.5 mg daily CCD      Assessment:       Therapeutic INR for goal of 2-3  . Permanent dosage adjustment is not necessary.    Plan:      1. Dose: Pt to continue current dose of 7.5mg  daily.   2. Weekly dose: 52.5 mg   3. Next INR: 5 weeks    PATIENT EDUCATION: Instructed patient to monitor themselves for signs or symptoms of bleeding and thromboembolism and to report to the emergency room if symptoms arise. Patient verbalized understanding of all instructions, and has been instructed to call the clinic with comments or questions.        "

## 2024-04-03 ENCOUNTER — Emergency Department: Admit: 2024-04-03 | Payer: Medicare (Managed Care) | Primary: Physician Assistant

## 2024-04-03 ENCOUNTER — Observation Stay
Admission: EM | Admit: 2024-04-03 | Discharge: 2024-04-05 | Disposition: A | Discharge status: Home / Self Care | Payer: Medicare (Managed Care) | Attending: Hospitalist | Admitting: Hospitalist

## 2024-04-03 ENCOUNTER — Inpatient Hospital Stay
Admit: 2024-04-03 | Discharge: 2024-04-03 | Disposition: A | Discharge status: Home / Self Care | Payer: Medicare (Managed Care) | Arrived: WI

## 2024-04-03 DIAGNOSIS — R042 Hemoptysis: Secondary | ICD-10-CM

## 2024-04-03 DIAGNOSIS — R197 Diarrhea, unspecified: Principal | ICD-10-CM

## 2024-04-03 LAB — CBC WITH AUTO DIFFERENTIAL
Basophils %: 0.1 % (ref 0.0–2.0)
Basophils Absolute: 0 x10e3/mcL (ref 0.0–0.2)
Eosinophils %: 0 % (ref 0.0–7.0)
Eosinophils Absolute: 0 x10e3/mcL (ref 0.0–0.5)
Hematocrit: 37.9 % (ref 34.0–47.0)
Hemoglobin: 12.5 g/dL (ref 11.5–15.7)
Immature Grans (Abs): 0.01 x10e3/mcL (ref 0.00–0.06)
Immature Granulocytes %: 0.1 % (ref 0.0–0.6)
Lymphocytes Absolute: 0.3 x10e3/mcL — ABNORMAL LOW (ref 1.0–3.2)
Lymphocytes: 2.5 % — ABNORMAL LOW (ref 15.0–45.0)
MCH: 31.2 pg (ref 27.0–34.5)
MCHC: 33 g/dL (ref 30.0–36.0)
MCV: 94.5 fL (ref 81.0–99.0)
MPV: 8.9 fL (ref 7.0–12.2)
Monocytes %: 3.2 % — ABNORMAL LOW (ref 4.0–12.0)
Monocytes Absolute: 0.4 x10e3/mcL (ref 0.3–1.0)
Neutrophils %: 94.1 % — ABNORMAL HIGH (ref 42.0–74.0)
Neutrophils Absolute: 10.2 x10e3/mcL — ABNORMAL HIGH (ref 1.6–7.3)
Platelets: 233 x10e3/mcL (ref 140–440)
RBC: 4.01 x10e6/mcL (ref 3.60–5.20)
RDW: 13.6 % (ref 10.0–17.0)
WBC: 10.8 x10e3/mcL — ABNORMAL HIGH (ref 3.8–10.6)

## 2024-04-03 LAB — COMPREHENSIVE METABOLIC PANEL
ALT: 16 U/L (ref 0–42)
AST: 29 U/L (ref 0–46)
Albumin/Globulin Ratio: 2 (ref 1.00–2.70)
Albumin: 4.3 g/dL (ref 3.5–5.2)
Alk Phosphatase: 80 U/L (ref 35–117)
Anion Gap: 12 mmol/L (ref 2–17)
BUN: 22 mg/dL (ref 8–23)
CO2: 29 mmol/L (ref 22–29)
Calcium: 9.2 mg/dL (ref 8.5–10.7)
Chloride: 99 mmol/L (ref 98–107)
Creatinine: 0.3 mg/dL — ABNORMAL LOW (ref 0.5–1.0)
Est, Glom Filt Rate: 110 mL/min/1.73m (ref >=60)
Globulin: 3 g/dL (ref 1.9–4.4)
Glucose: 147 mg/dL — ABNORMAL HIGH (ref 70–99)
Osmolaliy Calculated: 285 mosm/kg (ref 270–287)
Potassium: 3.5 mmol/L (ref 3.5–5.3)
Sodium: 140 mmol/L (ref 135–145)
Total Bilirubin: 0.67 mg/dL (ref 0.00–1.20)
Total Protein: 7.1 g/dL (ref 5.7–8.3)

## 2024-04-03 LAB — URINALYSIS W/ RFLX MICROSCOPIC
Bilirubin, Urine: NEGATIVE
Glucose, Ur: NEGATIVE mg/dL
Leukocyte Esterase, Urine: NEGATIVE
Nitrite, Urine: NEGATIVE
Specific Gravity, UA: >=1.03 — AB (ref 1.003–1.035)
Urobilinogen, Urine: 0.2 EU/dL (ref 0.2–1.0)
pH, Urine: 7.5 (ref 4.5–8.0)

## 2024-04-03 LAB — LIPASE: Lipase: 37 U/L (ref 13–60)

## 2024-04-03 LAB — LACTATE, SEPSIS
Lactate, Sepsis: 1.9 mmol/L (ref 0.5–2.0)
Lactate, Sepsis: 2.1 mmol/L — ABNORMAL HIGH (ref 0.5–2.0)

## 2024-04-03 LAB — LACTIC ACID: Lactic Acid: 1.6 mmol/L (ref 0.5–2.0)

## 2024-04-03 LAB — PROTIME-INR
INR: 2 (ref 1.5–3.5)
Protime: 22.4 s — ABNORMAL HIGH (ref 11.6–14.5)

## 2024-04-03 MED ORDER — ACETAMINOPHEN 500 MG PO TABS
500 | ORAL | Status: AC
Start: 2024-04-03 — End: 2024-04-03
  Administered 2024-04-03: 22:00:00 1000 mg via ORAL

## 2024-04-03 MED ORDER — AMOXICILLIN-POT CLAVULANATE 875-125 MG PO TABS
875-125 | ORAL_TABLET | Freq: Two times a day (BID) | ORAL | 0 refills | 10.00000 days | Status: DC
Start: 2024-04-03 — End: 2024-04-05

## 2024-04-03 MED ORDER — AMOXICILLIN-POT CLAVULANATE 250-125 MG PO TABS
250-125 | Freq: Once | ORAL | Status: AC
Start: 2024-04-03 — End: 2024-04-03
  Administered 2024-04-03: 19:00:00 1 via ORAL

## 2024-04-03 MED ORDER — IOPAMIDOL 61 % IV SOLN
61 | Freq: Once | INTRAVENOUS | Status: AC | PRN
Start: 2024-04-03 — End: 2024-04-03
  Administered 2024-04-03: 16:00:00 100 mL via INTRAVENOUS

## 2024-04-03 MED ORDER — ONDANSETRON 4 MG PO TBDP
4 | ORAL_TABLET | Freq: Four times a day (QID) | ORAL | 0 refills | 7.00000 days | Status: DC | PRN
Start: 2024-04-03 — End: 2024-04-05

## 2024-04-03 MED ORDER — SODIUM CHLORIDE 0.9 % IV BOLUS
0.9 | Freq: Once | INTRAVENOUS | Status: AC
Start: 2024-04-03 — End: 2024-04-03
  Administered 2024-04-03: 22:00:00 1000 mL via INTRAVENOUS

## 2024-04-03 MED ORDER — KETOROLAC TROMETHAMINE 15 MG/ML IJ SOLN
15 | Freq: Once | INTRAMUSCULAR | Status: AC
Start: 2024-04-03 — End: 2024-04-03
  Administered 2024-04-03: 19:00:00 15 mg via INTRAVENOUS

## 2024-04-03 MED ORDER — SODIUM CHLORIDE 0.9 % IV BOLUS
0.9 | Freq: Once | INTRAVENOUS | Status: AC
Start: 2024-04-03 — End: 2024-04-03
  Administered 2024-04-03: 15:00:00 500 mL via INTRAVENOUS

## 2024-04-03 MED ORDER — ONDANSETRON HCL 4 MG/2ML IJ SOLN
4 | Freq: Once | INTRAMUSCULAR | Status: AC
Start: 2024-04-03 — End: 2024-04-03
  Administered 2024-04-03: 15:00:00 4 mg via INTRAVENOUS

## 2024-04-03 MED ORDER — DICYCLOMINE HCL 20 MG PO TABS
20 | ORAL_TABLET | Freq: Four times a day (QID) | ORAL | 0 refills | 30.00000 days | Status: AC | PRN
Start: 2024-04-03 — End: ?

## 2024-04-03 MED ORDER — SODIUM CHLORIDE 0.9 % IV BOLUS
0.9 | Freq: Once | INTRAVENOUS | Status: AC
Start: 2024-04-03 — End: 2024-04-03
  Administered 2024-04-03: 17:00:00 500 mL via INTRAVENOUS

## 2024-04-03 MED ORDER — IOPAMIDOL 76 % IV SOLN
76 | Freq: Once | INTRAVENOUS | Status: AC | PRN
Start: 2024-04-03 — End: 2024-04-03
  Administered 2024-04-03: 22:00:00 100 mL via INTRAVENOUS

## 2024-04-03 MED FILL — ISOVUE-300 61 % IV SOLN: 61 % | INTRAVENOUS | Qty: 100 | Fill #0

## 2024-04-03 MED FILL — AMOXICILLIN-POT CLAVULANATE 250-125 MG PO TABS: 250-125 mg | ORAL | Qty: 1 | Fill #0

## 2024-04-03 MED FILL — TYLENOL EXTRA STRENGTH 500 MG PO TABS: 500 mg | ORAL | Qty: 2 | Fill #0

## 2024-04-03 MED FILL — ONDANSETRON HCL 4 MG/2ML IJ SOLN: 4 MG/2ML | INTRAMUSCULAR | Qty: 2 | Fill #0

## 2024-04-03 MED FILL — KETOROLAC TROMETHAMINE 15 MG/ML IJ SOLN: 15 mg/mL | INTRAMUSCULAR | Qty: 1 | Fill #0

## 2024-04-03 NOTE — ED Provider Notes (Signed)
 " [facility] EMERGENCY DEPARTMENT  EMERGENCY DEPARTMENT ENCOUNTER      Pt Name: Mia Roy  MRN: [id number]  Birthdate 08/14/1945  Date of evaluation: 04/03/2024  Provider: Lum DELENA Lunger, PA-C    CHIEF COMPLAINT       Chief Complaint   Patient presents with    Vomiting     Pt states she has been vomiting 20 minutely since last night with diarrheoa.         HISTORY OF PRESENT ILLNESS    HPI  78 y.o. female with a past medical history of A-fib presents emergency department for evaluation of vomiting, diarrhea, abdominal pain.  Patient states that she has been vomiting every 20 minutes for the last 12 hours.  She noticed a few streaks of bright red blood in her vomit prior to arrival.  She is on Xarelto.  Denies any coffee-ground emesis.  Notes she has had several episodes of looser stool.  Denies any blood in her stool.  Reports generalized abdominal pain particularly when she is vomiting.  No fevers or chills.  Denies urinary symptoms.  No chest pain or shortness of breath    Nursing Notes were reviewed.    REVIEW OF SYSTEMS       Review of Systems    Except as noted above the remainder of the review of systems was reviewed and negative.       PAST MEDICAL HISTORY   No past medical history on file.    SURGICAL HISTORY     No past surgical history on file.    CURRENT MEDICATIONS       Discharge Medication List as of 04/03/2024  2:36 PM          ALLERGIES     Clarithromycin    FAMILY HISTORY     No family history on file.     SOCIAL HISTORY       Social History     Socioeconomic History    Marital status: Married   Substance and Sexual Activity    Alcohol use: Never         PHYSICAL EXAM       ED Triage Vitals [04/03/24 1109]   BP Girls Systolic BP Percentile Girls Diastolic BP Percentile Boys Systolic BP Percentile Boys Diastolic BP Percentile Temp Temp Source Pulse   (!) 149/68 -- -- -- -- 98.9 F (37.2 C) Oral 86      Respirations SpO2 Height Weight - Scale       16 97 % 1.575 m  (5' 2) 57.1 kg (125 lb 14.4 oz)           Physical Exam  Vitals and nursing note reviewed.   Constitutional:       General: She is not in acute distress.     Appearance: She is not ill-appearing.   HENT:      Head: Normocephalic and atraumatic.      Mouth/Throat:      Comments: Airway patent  Cardiovascular:      Rate and Rhythm: Normal rate.      Pulses: Normal pulses.      Comments: Normal peripheral perfusion  Pulmonary:      Effort: Pulmonary effort is normal. No respiratory distress.      Breath sounds: Normal breath sounds.   Abdominal:      General: Abdomen is flat. Bowel sounds are normal.      Palpations: Abdomen is soft.  Tenderness: There is generalized abdominal tenderness. There is no guarding or rebound.      Comments: Generalized abdominal pain without rebound tenderness or guarding.  No peritoneal signs   Musculoskeletal:         General: No deformity.      Right lower leg: No edema.      Left lower leg: No edema.   Skin:     General: Skin is warm and dry.   Neurological:      General: No focal deficit present.      Mental Status: She is alert and oriented to person, place, and time. Mental status is at baseline.      Comments: Alert; Moving all extremities   Psychiatric:         Mood and Affect: Mood normal.         Behavior: Behavior normal.         DIAGNOSTIC RESULTS   PROCEDURES:  Unless otherwise noted below, none     Procedures    EKG: All EKG's are interpreted by the Emergency Department Physician who either signs or Co-signs this chart in the absence of a cardiologist.    RADIOLOGY:   Non-plain film images such as CT, Ultrasound and MRI are read by the radiologist. Plain radiographic images are visualized and preliminarily interpreted by the emergency physician with the below findings:    Interpretation per the Radiologist below, if available at the time of this note:    XR CHEST (2 VW)   Final Result   Enlarged right axilla. Mild diffuse interstitial opacities in the lungs may be    due  to pulmonary vascular congestion without overt edema, or interstitial    pneumonia.      CT ABDOMEN PELVIS W IV CONTRAST Additional Contrast? None   Final Result      No acute process in the abdomen or pelvis.      Mild degree of patchy airspace opacities in the right greater than left lung    bases suspicious for pneumonia          LABS:  Labs Reviewed   CBC WITH AUTO DIFFERENTIAL - Abnormal; Notable for the following components:       Result Value    WBC 10.8 (*)     Neutrophils % 94.1 (*)     Lymphocytes 2.5 (*)     Monocytes % 3.2 (*)     Neutrophils Absolute 10.2 (*)     Lymphocytes Absolute 0.3 (*)     All other components within normal limits   COMPREHENSIVE METABOLIC PANEL - Abnormal; Notable for the following components:    Glucose 147 (*)     Creatinine 0.3 (*)     All other components within normal limits   LACTATE, SEPSIS - Abnormal; Notable for the following components:    Lactate, Sepsis 2.1 (*)     All other components within normal limits   URINALYSIS W/ RFLX MICROSCOPIC - Abnormal; Notable for the following components:    Specific Gravity, UA >=1.030 (*)     All other components within normal limits   LIPASE   LACTATE, SEPSIS   LACTIC ACID       All other labs were within normal range or not returned as of this dictation.    EMERGENCY DEPARTMENT COURSE/REASSESSMENT and MDM:   Vitals:    Vitals:    04/03/24 1244 04/03/24 1324 04/03/24 1350 04/03/24 1434   BP: (!) 135/59 (!) 125/56 125/62  Pulse:       Resp:       Temp:       TempSrc:       SpO2: 97% 97% 90% 97%   Weight:       Height:           Provider evaluation time: 04/03/24 1107    ED Course:      ED Course as of 04/03/24 1719   Sat Apr 03, 2024   1136 WBC(!): 10.8 [MM]   1248 Lactate, Sepsis(!): 2.1 [MM]   1249 CT ABDOMEN PELVIS W IV CONTRAST Additional Contrast? None  No acute process in the abdomen or pelvis.     Mild degree of patchy airspace opacities in the right greater than left lung bases suspicious for pneumonia   [MM]   1435 XR  CHEST (2 VW)   Mild diffuse interstitial opacities in the lungs may be due to pulmonary vascular congestion without overt edema, or interstitial pneumonia.   [MM]      ED Course User Index  [MM] Gladis Lum LABOR PA-C         Medical Decision Making  78 year old female presents emergency department with concerns of nausea, vomiting, diarrhea, abdominal pain since last night.  She is well-appearing and nontoxic here.  She has normal vital signs.  She is afebrile.  Abdomen is minimally tender without any rebound tenderness or guarding.  Minimal leukocytosis of 10.8.  Lactic minimally elevated 2.1, downtrending to 1.6 after fluids.  She does report a few streaks of bright red blood in her vomit earlier today, on Coumadin.  Her hemoglobin is stable.  No further episodes of this.  CT abdomen pelvis without evidence of acute process in the abdomen or pelvis however there was a mild degree of patchy airspace opacities concerning for pneumonia.  Patient has normal work of breathing, not hypoxic, not complaining any cough, fevers, shortness of breath or other upper respiratory infection symptoms. Chest x-ray with mild diffuse interstitial opacities which may be due to pulmonary vascular congestion versus interstitial pneumonia.  Will treat for infection. First dose of antibiotics given here. Her curb 65 score is 1. Will plan for discharge home. Encourage close follow-up with her PCP she was amenable with this.  Vitals are stable and she is in no acute distress on discharge.  Strict return precautions given.  Discussed with my attending, Dr. Lane, who agrees with this assessment and plan    Amount and/or Complexity of Data Reviewed  Labs: ordered. Decision-making details documented in ED Course.  Radiology: ordered. Decision-making details documented in ED Course.    Risk  Prescription drug management.         CONSULTS:  None    FINAL IMPRESSION      1. Nausea vomiting and diarrhea    2. Generalized abdominal pain    3.  Pneumonia due to infectious organism, unspecified laterality, unspecified part of lung          DISPOSITION/PLAN   DISPOSITION Decision To Discharge 04/03/2024 02:34:51 PM   DISPOSITION CONDITION Stable           PATIENT REFERRED TO:  No follow-up provider specified.    DISCHARGE MEDICATIONS:  Discharge Medication List as of 04/03/2024  2:36 PM        START taking these medications    Details   amoxicillin -clavulanate (AUGMENTIN ) 875-125 MG per tablet Take 1 tablet by mouth 2 times daily for 7 days, Disp-14 tablet, R-0Normal  ondansetron  (ZOFRAN -ODT) 4 MG disintegrating tablet Take 1 tablet by mouth 4 times daily as needed for Nausea or Vomiting, Disp-20 tablet, R-0Normal      dicyclomine  (BENTYL ) 20 MG tablet Take 1 tablet by mouth every 6 hours as needed (Abdominal Pain), Disp-30 tablet, R-0Normal             Controlled Substances Monitoring:          No data to display                (Please note that portions of this note were completed with a voice recognition program.  Efforts were made to edit the dictations but occasionally words are mis-transcribed.)    Lum DELENA Lunger, PA-C (electronically signed)  Emergency Physician Assistant PA-C         Lunger Lum DELENA, PA-C  04/03/24 1455       Lunger Lum A, PA-C  04/03/24 1627       Lunger Lum DELENA, [place]  04/03/24 1719    "

## 2024-04-03 NOTE — H&P (Addendum)
 "   [facility] Hospitalist Service     Hospitalist H & P     Name: Mia Roy   DOB: 05/19/1946 (Age: 78 y.o.)   Date of Admission: 04/03/24    Primary Care Provider: Rayford Niels CROME, PA  Attending: Ozell JAYSON Frees, DO    Chief Complaint   Patient presents with    Hemoptysis     Pt ambulatory to triage c/o coughing up a bright red blood clot after being discharged from ER 1 hour ago. Pt is on coumadin         History of Present Illness     Mia Roy is a 78 y.o. female with a PMHx significant for atrial fibrillation on anticoagulation presents with hematemesis and hemoptysis    Patient is manage on Coumadin and had INR 2.0 today    Patient was seen 2 separate times today    Earlier in the day patient was seen for what appeared to be gastroenteritis with vomiting that started 11:30 PM and went every 20 minutes all night long with diarrhea starting later in the evening with just as much frequency, the vomiting stopped at 8 AM there is nothing more to vomit other than streaks of blood.  They noticed pneumonia on a CT scan of the abdomen.    The patient was seen in the ED and sent home on Augmentin     When the patient t came home she began having these quarter sized flecks of hemoptysis, this concerned her so she presents here for further evaluation, her second of the day.    Patient denies any blood in her stool or dark tarry stool.    Lactic acid initially elevated 2.1 but downtrending to 1.6 after fluids.    CT scan of the chest was pursued that ruled out PE but confirmed pneumonia.    No pulmonary embolism.  Enlarged heart. Mild bibasilar groundglass patchy   opacities which may be due to very mild pulmonary edema or infection, or   alternatively small amount of pulmonary hemorrhage.    Mild degree of patchy airspace opacities in the right greater than left lung   bases suspicious for pneumonia    No acute process in the abdomen or pelvis.     Mild degree of patchy airspace opacities in the right greater  than left lung   bases suspicious for pneumonia    INR 1.0 hemoglobin 12.5 white blood cell count 10.8 platelet count 233       Past Medical History   No past medical history on file.     Past Surgical History   No past surgical history on file.     Allergies   Clarithromycin     Medications     Current Outpatient Medications   Medication Instructions    amoxicillin -clavulanate (AUGMENTIN ) 875-125 MG per tablet 1 tablet, Oral, 2 TIMES DAILY    dicyclomine  (BENTYL ) 20 mg, Oral, EVERY 6 HOURS PRN    ondansetron  (ZOFRAN -ODT) 4 mg, Oral, 4 TIMES DAILY PRN         Family History   No family history on file.     Social History      Social History     Socioeconomic History    Marital status: Married     Spouse name: Not on file    Number of children: Not on file    Years of education: Not on file    Highest education level: Not on file   Occupational History  Not on file   Tobacco Use    Smoking status: Not on file    Smokeless tobacco: Not on file   Substance and Sexual Activity    Alcohol use: Never    Drug use: Not on file    Sexual activity: Not on file   Other Topics Concern    Not on file   Social History Narrative    Not on file     Social Drivers of Health     Financial Resource Strain: Not on file   Food Insecurity: Not on file   Transportation Needs: Not on file   Physical Activity: Not on file   Stress: Not on file   Social Connections: Not on file   Intimate Partner Violence: Not on file   Housing Stability: Not on file        Review of Systems (positives bolded, otherwise negative)     14 point review of system is negative except for HPI    Except as otherwise documented above, the patient's complete review of systems was unremarkable.       Physical Exam     BP 131/64   Pulse 82   Temp 98.3 F (36.8 C) (Oral)   Resp 18   Ht 1.575 m (5' 2)   Wt 61.2 kg (135 lb)   SpO2 96%   BMI 24.69 kg/m     General - Awake and responsive, cooperative with examination. Non-toxic appearing  Head - NCAT  ENT - No  Lympadenopathy, moist mucous membranes  Eyes - Non-icteric sclera, no subconjunctival pallor  Cardio - RRR, without MRG  Pulm - CTA bilaterally, no wheezing, normal respiratory effort  GI - Soft, ND NT with normoactive BS, no guarding or rebound tenderness  Vascular - Good peripheral pulses bilateral UE / LE, no edema  MS - Symmetrical muscle strength bilateral upper and lower extremities  Skin - No rashes or suspicious lesions observed; no localized erythema  Neuro - No focal weakness, no slurred speech, no facial droop  Psychiatric - Appropriate mood and affect       Labs and Imaging     Labs:   Hematologic/Coags Chemistries   Recent Labs     04/03/24  1120   WBC 10.8*   HGB 12.5   HCT 37.9   PLT 233     Lab Results   Component Value Date/Time    ALBUMIN 4.3 04/03/2024 11:20 AM     No components found for: HGBA1C  Lab Results   Component Value Date/Time    INR 2.0 04/03/2024 06:02 PM    PROTIME 22.4 04/03/2024 06:02 PM     No results found for: APTT  No results found for: DDIMER   Recent Labs     04/03/24  1120 04/03/24  1802   NA 140  --    K 3.5  --    CL 99  --    CO2 29  --    BUN 22  --    CREATININE 0.3*  --    PROTIME  --  22.4*   ALBUMIN 4.3  --    BILITOT 0.67  --    ALKPHOS 80  --    AST 29  --    ALT 16  --      No results for input(s): GLU in the last 72 hours.  No results found for: CPK, CKMB, TROPONINI  No results found for: IRON, FERRITIN     Inflammatory/Respiratory  Diabetes   No results found for: CRP  No results found for: ESR  ABGs:  No results found for: PHART, PO2ART, HCO3, PCO2ART   Lab Results   Component Value Date/Time    CREATININE 0.3 04/03/2024 11:20 AM                CTA Chest W/WO Contrast PE Eval   Final Result   No pulmonary embolism.  Enlarged heart. Mild bibasilar groundglass patchy    opacities which may be due to very mild pulmonary edema or infection, or    alternatively small amount of pulmonary hemorrhage.      .            Past Cardiac  History    EKG: {    ECHO:    Past Microbiologic History (if applicable)  No results for input(s): SDES, CULTURE in the last 72 hours.     Assessment & Plan        Hemoptysis, likely aspirated from copious amounts of vomiting from gastroenteritis, led to coughing and hemoptysis as patient is on Coumadin with INR 2.0, consult pulmonary medicine to start Zosyn , for completeness to procalcitonin molecular studies although this seems like aspiration    GI bleed hematemesis, will do Protonix  IV, INR 2.0 inclined to just withhold Coumadin for now and not reverse anticoagulation unless patient has a more clinically significant event of GI bleeding    Possible pneumonia, check procalcitonin molecular studies  To treat with Zosyn  for possible aspiration, there is a paucity of upper respiratory symptoms no coughing, resolved lactic acid elevation  -- With the vomiting aspiration is possible, in setting white blood cell count inclined to treat    Acute gastroenteritis, nausea vomiting CT unremarkable supportive care antiemetics fluids    Current use of anticoagulation with warfarin, for now we will to simply not continue warfarin and recheck in early morning, no compelling need to reverse with vitamin K    History of TIA in the past    Atrial fibrillation, still trying to determine home medications                Disposition         Ozell JAYSON Frees, DO  04/03/2024 7:53 PM  [facility] Hospitalist Service    +++++++++++++++++++++++++++++++++++++++++++++++++++++++++++++    This note was created using voice recognition software and may contain typographic errors missed during final review. The intent is to have a complete and accurate medical record.   As a valued partner in this safety effort, if you have noted factual errors, please complete the Health Information Amendment/Correct Form or call the [facility] Health Information Management Office at [phone].   "

## 2024-04-03 NOTE — Discharge Instructions (Signed)
"  Take your antibiotics as prescribed and until completion.  Drink plenty of fluids and get plenty of rest as well.  You were given Zofran  for nausea and vomiting.  You were given Bentyl  for abdominal cramping.  Follow-up with your PCP in the next week.  Return to the emergency department for any new, worsening, concerning symptoms  "

## 2024-04-03 NOTE — ED Provider Notes (Signed)
 "[facility] EMERGENCY DEPARTMENT  EMERGENCY DEPARTMENT ENCOUNTER      Pt Name: Mia Roy  MRN: [id number]  Birthdate 27-Apr-1946  Date of evaluation: 04/03/2024  Provider: Genell Jama Moats, PA-C    CHIEF COMPLAINT       Chief Complaint   Patient presents with    Hemoptysis     Pt ambulatory to triage c/o coughing up a bright red blood clot after being discharged from ER 1 hour ago. Pt is on coumadin        HISTORY OF PRESENT ILLNESS    HPI  78 y.o. female with a history of muscular dystrophy, A-fib, cardiomyopathy presents for evaluation of hemoptysis.  Patient was seen at this department earlier today after experiencing nausea vomiting and loose stool starting at 11 PM last night.  She had a CT scan, labs and chest x-ray that showed pneumonia.  She was discharged on antibiotics.  When she returned home she had a coughing spell and coughed up a few quarter sized blood clots and a few smaller sized blood clots.  Prior to this she had had no significant cough.  She denied fever, shortness of breath, wheezing, URI symptoms.  She is on Coumadin her INR was checked 4 days ago and was 2.5.    Nursing Notes were reviewed.  REVIEW OF SYSTEMS       Review of Systems  Except as noted above the remainder of the review of systems was reviewed and negative.   PAST MEDICAL HISTORY   No past medical history on file.  SURGICAL HISTORY     No past surgical history on file.  CURRENT MEDICATIONS       Previous Medications    AMOXICILLIN -CLAVULANATE (AUGMENTIN ) 875-125 MG PER TABLET    Take 1 tablet by mouth 2 times daily for 7 days    DICYCLOMINE  (BENTYL ) 20 MG TABLET    Take 1 tablet by mouth every 6 hours as needed (Abdominal Pain)    ONDANSETRON  (ZOFRAN -ODT) 4 MG DISINTEGRATING TABLET    Take 1 tablet by mouth 4 times daily as needed for Nausea or Vomiting     ALLERGIES     Clarithromycin  FAMILY HISTORY     No family history on file.   SOCIAL HISTORY       Social History     Socioeconomic History     Marital status: Married   Substance and Sexual Activity    Alcohol use: Never     PHYSICAL EXAM       ED Triage Vitals [04/03/24 1617]   BP Girls Systolic BP Percentile Girls Diastolic BP Percentile Boys Systolic BP Percentile Boys Diastolic BP Percentile Temp Temp Source Pulse   (!) 147/68 -- -- -- -- 98.3 F (36.8 C) Oral 82      Respirations SpO2 Height Weight - Scale       18 96 % 1.575 m (5' 2) 61.2 kg (135 lb)           Physical Exam  Vitals and nursing note reviewed.   Constitutional:       Appearance: Normal appearance.   HENT:      Head: Normocephalic and atraumatic.   Cardiovascular:      Rate and Rhythm: Normal rate and regular rhythm.      Heart sounds: Normal heart sounds.   Pulmonary:      Effort: Pulmonary effort is normal.      Breath sounds: Normal breath sounds.  Abdominal:      General: Abdomen is flat.      Palpations: Abdomen is soft.      Tenderness: There is no abdominal tenderness.   Skin:     General: Skin is warm and dry.   Neurological:      General: No focal deficit present.      Mental Status: She is alert and oriented to person, place, and time.   Psychiatric:         Mood and Affect: Mood normal.         Behavior: Behavior normal.         DIAGNOSTIC RESULTS   PROCEDURES:  Unless otherwise noted below, none     Procedures    RADIOLOGY:   Non-plain film images such as CT, Ultrasound and MRI are read by the radiologist. Plain radiographic images are visualized and preliminarily interpreted by the emergency physician with the below findings:    Interpretation per the Radiologist below, if available at the time of this note:    CTA Chest W/WO Contrast PE Eval   Final Result   No pulmonary embolism.  Enlarged heart. Mild bibasilar groundglass patchy    opacities which may be due to very mild pulmonary edema or infection, or    alternatively small amount of pulmonary hemorrhage.      SABRA          LABS:  Results for orders placed or performed during the hospital encounter of 04/03/24    Protime-INR   Result Value Ref Range    Protime 22.4 (H) 11.6 - 14.5 seconds    INR 2.0 1.5 - 3.5       All other labs were within normal range or not returned as of this dictation.    EMERGENCY DEPARTMENT COURSE/REASSESSMENT and MDM:   Vitals:    Vitals:    04/03/24 1617 04/03/24 1757   BP: (!) 147/68 131/64   Pulse: 82    Resp: 18    Temp: 98.3 F (36.8 C)    TempSrc: Oral    SpO2: 96%    Weight: 61.2 kg (135 lb)    Height: 1.575 m (5' 2)        ED Course:    ED Course as of 04/03/24 2051   Sat Apr 03, 2024   1851 INR: 2.0  Therapeutic, goal is 2-3 [LD]   1949 CTA Chest W/WO Contrast PE Eval  Bibasilar patchy opacities could represent mild pulmonary edema versus infection or Neri hemorrhage.  Given that she is reporting small amount of hemoptysis and is on blood thinners and has had 2 visits today regarding her symptoms, patient is agreeable for observation.  Discussed with attending physician. [LD]   2014 Dr. Leonel accepts patient for admission [LD]      ED Course User Index  [LD] Nicholaus Genell Ruth, PA-C       MDM  Number of Diagnoses or Management Options  Hemoptysis  Diagnosis management comments: 78 year old female presents with hemoptysis, described as coughing up a few smaller blood clots.  Patient's vitals are stable, she is well-appearing, no acute distress, nontoxic.  She speaking full sentences, lungs are CTAB.  She was seen in this department several hours ago for nausea and vomiting with CT abdomen pelvis and chest x-ray demonstrating pneumonia, stable labs.  Of note she is on Coumadin had INR checked 4 days ago was 2.5.    Discussed with Dr. Juliaette, will proceed with CTA chest for further  evaluation of hemoptysis.       Amount and/or Complexity of Data Reviewed  Clinical lab tests: reviewed  Tests in the radiology section of CPT: reviewed        FINAL IMPRESSION      1. Hemoptysis          DISPOSITION/PLAN   DISPOSITION Admitted 04/03/2024 08:03:02 PM               PATIENT REFERRED TO:  No  follow-up provider specified.    DISCHARGE MEDICATIONS:  New Prescriptions    No medications on file     (Please note that portions of this note were completed with a voice recognition program.  Efforts were made to edit the dictations but occasionally words are mis-transcribed.)  Genell Jama Moats, PA-C (electronically signed)              Moats Genell Jama, PA-C  04/03/24 2051    "

## 2024-04-04 LAB — CBC WITH AUTO DIFFERENTIAL
Basophils %: 0.2 % (ref 0.0–2.0)
Basophils Absolute: 0 x10e3/mcL (ref 0.0–0.2)
Eosinophils %: 0 % (ref 0.0–7.0)
Eosinophils Absolute: 0 x10e3/mcL (ref 0.0–0.5)
Hematocrit: 31.7 % — ABNORMAL LOW (ref 34.0–47.0)
Hemoglobin: 10.1 g/dL — ABNORMAL LOW (ref 11.5–15.7)
Immature Grans (Abs): 0 x10e3/mcL (ref 0.00–0.06)
Immature Granulocytes %: 0 % (ref 0.0–0.6)
Lymphocytes Absolute: 0.6 x10e3/mcL — ABNORMAL LOW (ref 1.0–3.2)
Lymphocytes: 13.4 % — ABNORMAL LOW (ref 15.0–45.0)
MCH: 31.3 pg (ref 27.0–34.5)
MCHC: 31.9 g/dL (ref 30.0–36.0)
MCV: 98.1 fL (ref 81.0–99.0)
MPV: 8.8 fL (ref 7.0–12.2)
Monocytes %: 10.1 % (ref 4.0–12.0)
Monocytes Absolute: 0.5 x10e3/mcL (ref 0.3–1.0)
Neutrophils %: 76.3 % — ABNORMAL HIGH (ref 42.0–74.0)
Neutrophils Absolute: 3.6 x10e3/mcL (ref 1.6–7.3)
Platelets: 181 x10e3/mcL (ref 140–440)
RBC: 3.23 x10e6/mcL — ABNORMAL LOW (ref 3.60–5.20)
RDW: 14.5 % (ref 10.0–17.0)
WBC: 4.8 x10e3/mcL (ref 3.8–10.6)

## 2024-04-04 LAB — RESPIRATORY PANEL, MOLECULAR
Adenovirus: NOT DETECTED
Bordetella Parapertussis: NOT DETECTED
Bordetella Pertussis: NOT DETECTED
Chlamydia Pneumoniae: NOT DETECTED
Coronavirus 299E: NOT DETECTED
Coronavirus HKU1: NOT DETECTED
Coronavirus NL63: NOT DETECTED
Coronavirus OC43: NOT DETECTED
Human Metapneumovirus: NOT DETECTED
Human Rhinovirus/Enterovirus: NOT DETECTED
Influenza A: NOT DETECTED
Influenza B: NOT DETECTED
Mycoplasma pneumoniae: NOT DETECTED
Parainfluenza 1: NOT DETECTED
Parainfluenza 2: NOT DETECTED
Parainfluenza 3: NOT DETECTED
Parainfluenza: NOT DETECTED
Respiratory Syncytial Virus: NOT DETECTED
SARS-CoV-2: NOT DETECTED

## 2024-04-04 LAB — PROTIME-INR
INR: 1.8 (ref 1.5–3.5)
Protime: 21 s — ABNORMAL HIGH (ref 11.6–14.5)

## 2024-04-04 LAB — C. DIFFICILE TOXIN A/B
C Diff Toxin Interpretation: NEGATIVE
C.diff Toxin/Antigen: NEGATIVE

## 2024-04-04 LAB — POTASSIUM: Potassium: 3.5 mmol/L (ref 3.5–5.3)

## 2024-04-04 LAB — COMPREHENSIVE METABOLIC PANEL
ALT: 19 U/L (ref 0–42)
AST: 29 U/L (ref 0–46)
Albumin/Globulin Ratio: 2 (ref 1.00–2.70)
Albumin: 3.5 g/dL (ref 3.5–5.2)
Alk Phosphatase: 64 U/L (ref 35–117)
Anion Gap: 9 mmol/L (ref 2–17)
BUN: 20 mg/dL (ref 8–23)
CO2: 28 mmol/L (ref 22–29)
Calcium: 7.8 mg/dL — ABNORMAL LOW (ref 8.5–10.7)
Chloride: 107 mmol/L (ref 98–107)
Creatinine: 0.4 mg/dL — ABNORMAL LOW (ref 0.5–1.0)
Est, Glom Filt Rate: 103 mL/min/1.73m (ref >=60)
Globulin: 2 g/dL (ref 1.9–4.4)
Glucose: 98 mg/dL (ref 70–99)
Osmolaliy Calculated: 289 mosm/kg — ABNORMAL HIGH (ref 270–287)
Potassium: 3 mmol/L — CL (ref 3.5–5.3)
Sodium: 144 mmol/L (ref 135–145)
Total Bilirubin: 0.55 mg/dL (ref 0.00–1.20)
Total Protein: 5.7 g/dL (ref 5.7–8.3)

## 2024-04-04 LAB — PROCALCITONIN: Procalcitonin: 0.12 ng/mL (ref <=0.24)

## 2024-04-04 LAB — POTASSIUM, WHOLE BLOOD: Potassium, Whole Blood: 3.4 mmol/L — ABNORMAL LOW (ref 3.5–5.1)

## 2024-04-04 LAB — MAGNESIUM: Magnesium: 1.9 mg/dL (ref 1.6–2.6)

## 2024-04-04 LAB — CULTURE, STOOL

## 2024-04-04 MED ORDER — SODIUM CHLORIDE 0.9 % IV SOLN
0.9 | INTRAVENOUS | Status: DC | PRN
Start: 2024-04-04 — End: 2024-04-05

## 2024-04-04 MED ORDER — ONDANSETRON HCL 4 MG/2ML IJ SOLN
4 | Freq: Four times a day (QID) | INTRAMUSCULAR | Status: DC | PRN
Start: 2024-04-04 — End: 2024-04-05

## 2024-04-04 MED ORDER — AMOXICILLIN-POT CLAVULANATE 875-125 MG PO TABS
875-125 | Freq: Two times a day (BID) | ORAL | Status: DC
Start: 2024-04-04 — End: 2024-04-05
  Administered 2024-04-05 (×2): 1 via ORAL

## 2024-04-04 MED ORDER — POTASSIUM CHLORIDE 10 MEQ/100ML IV SOLN
10 | INTRAVENOUS | Status: DC
Start: 2024-04-04 — End: 2024-04-04
  Administered 2024-04-04 (×2): 10 meq via INTRAVENOUS

## 2024-04-04 MED ORDER — PANTOPRAZOLE SODIUM 40 MG IV SOLR
40 | Freq: Two times a day (BID) | INTRAVENOUS | Status: DC
Start: 2024-04-04 — End: 2024-04-05
  Administered 2024-04-04 – 2024-04-05 (×4): 40 mg via INTRAVENOUS

## 2024-04-04 MED ORDER — SODIUM CHLORIDE 0.9 % IV SOLN (MINI-BAG)
0.9 | Freq: Four times a day (QID) | INTRAVENOUS | Status: DC
Start: 2024-04-04 — End: 2024-04-04
  Administered 2024-04-04: 21:00:00 3000 mg via INTRAVENOUS

## 2024-04-04 MED ORDER — GUAIFENESIN-DM 100-10 MG/5ML PO SYRP
100-10 | ORAL | Status: DC | PRN
Start: 2024-04-04 — End: 2024-04-05

## 2024-04-04 MED ORDER — ACETAMINOPHEN 325 MG PO TABS
325 | Freq: Four times a day (QID) | ORAL | Status: DC | PRN
Start: 2024-04-04 — End: 2024-04-05
  Administered 2024-04-04 (×2): 650 mg via ORAL

## 2024-04-04 MED ORDER — LOPERAMIDE HCL 2 MG PO CAPS
2 | Freq: Four times a day (QID) | ORAL | Status: DC | PRN
Start: 2024-04-04 — End: 2024-04-04
  Administered 2024-04-04: 21:00:00 2 mg via ORAL

## 2024-04-04 MED ORDER — NORMAL SALINE FLUSH 0.9 % IV SOLN
0.9 | Freq: Two times a day (BID) | INTRAVENOUS | Status: DC
Start: 2024-04-04 — End: 2024-04-05
  Administered 2024-04-04 – 2024-04-05 (×4): 10 mL via INTRAVENOUS

## 2024-04-04 MED ORDER — PIPERACILLIN SOD-TAZOBACTAM SO 4.5 (4-0.5) G IV SOLR
4.5 | Freq: Three times a day (TID) | INTRAVENOUS | Status: DC
Start: 2024-04-04 — End: 2024-04-04
  Administered 2024-04-04 (×3): 4500 mg via INTRAVENOUS

## 2024-04-04 MED ORDER — ALUM & MAG HYDROXIDE-SIMETH 200-200-20 MG/5ML PO SUSP
200-200-20 | Freq: Four times a day (QID) | ORAL | Status: DC | PRN
Start: 2024-04-04 — End: 2024-04-05

## 2024-04-04 MED ORDER — MAGNESIUM SULFATE 2 GM/50ML IV SOLN
2 | INTRAVENOUS | Status: DC | PRN
Start: 2024-04-04 — End: 2024-04-05

## 2024-04-04 MED ORDER — POTASSIUM CHLORIDE CRYS ER 20 MEQ PO TBCR
20 | Freq: Once | ORAL | Status: AC
Start: 2024-04-04 — End: 2024-04-04
  Administered 2024-04-04: 17:00:00 20 meq via ORAL

## 2024-04-04 MED ORDER — HYDROCODONE-ACETAMINOPHEN 5-325 MG PO TABS
5-325 | Freq: Four times a day (QID) | ORAL | Status: DC | PRN
Start: 2024-04-04 — End: 2024-04-05

## 2024-04-04 MED ORDER — POLYETHYLENE GLYCOL 3350 17 G PO PACK
17 | Freq: Every day | ORAL | Status: DC | PRN
Start: 2024-04-04 — End: 2024-04-05

## 2024-04-04 MED ORDER — LOPERAMIDE HCL 2 MG PO CAPS
2 | Freq: Four times a day (QID) | ORAL | Status: DC | PRN
Start: 2024-04-04 — End: 2024-04-05
  Administered 2024-04-05 (×2): 2 mg via ORAL

## 2024-04-04 MED ORDER — TRAZODONE HCL 50 MG PO TABS
50 | Freq: Every evening | ORAL | Status: DC
Start: 2024-04-04 — End: 2024-04-05
  Administered 2024-04-05: 01:00:00 100 mg via ORAL

## 2024-04-04 MED ORDER — BISACODYL 5 MG PO TBEC
5 | Freq: Every day | ORAL | Status: DC | PRN
Start: 2024-04-04 — End: 2024-04-05

## 2024-04-04 MED ORDER — HYDRALAZINE HCL 20 MG/ML IJ SOLN
20 | Freq: Four times a day (QID) | INTRAMUSCULAR | Status: DC | PRN
Start: 2024-04-04 — End: 2024-04-05

## 2024-04-04 MED ORDER — ACETAMINOPHEN 650 MG RE SUPP
650 | Freq: Four times a day (QID) | RECTAL | Status: DC | PRN
Start: 2024-04-04 — End: 2024-04-05

## 2024-04-04 MED ORDER — POTASSIUM BICARB-CITRIC ACID 20 MEQ PO TBEF
20 | Freq: Once | ORAL | Status: AC
Start: 2024-04-04 — End: 2024-04-04
  Administered 2024-04-04: 12:00:00 20 meq via ORAL

## 2024-04-04 MED ORDER — ONDANSETRON 4 MG PO TBDP
4 | Freq: Three times a day (TID) | ORAL | Status: DC | PRN
Start: 2024-04-04 — End: 2024-04-05
  Administered 2024-04-04: 4 mg via ORAL

## 2024-04-04 MED ORDER — POTASSIUM CHLORIDE 10 MEQ/100ML IV SOLN
10 | INTRAVENOUS | Status: DC | PRN
Start: 2024-04-04 — End: 2024-04-05

## 2024-04-04 MED ORDER — POTASSIUM CHLORIDE CRYS ER 20 MEQ PO TBCR
20 | ORAL | Status: DC | PRN
Start: 2024-04-04 — End: 2024-04-05

## 2024-04-04 MED ORDER — SIMETHICONE 80 MG PO CHEW
80 | Freq: Four times a day (QID) | ORAL | Status: DC | PRN
Start: 2024-04-04 — End: 2024-04-05
  Administered 2024-04-05: 01:00:00 80 mg via ORAL

## 2024-04-04 MED ORDER — POTASSIUM CHLORIDE CRYS ER 20 MEQ PO TBCR
20 | Freq: Once | ORAL | Status: AC
Start: 2024-04-04 — End: 2024-04-04
  Administered 2024-04-04: 22:00:00 40 meq via ORAL

## 2024-04-04 MED ORDER — NORMAL SALINE FLUSH 0.9 % IV SOLN
0.9 | INTRAVENOUS | Status: DC | PRN
Start: 2024-04-04 — End: 2024-04-05

## 2024-04-04 MED ORDER — MELATONIN 5 MG PO TABS
5 | Freq: Every evening | ORAL | Status: DC | PRN
Start: 2024-04-04 — End: 2024-04-05
  Administered 2024-04-04: 02:00:00 5 mg via ORAL

## 2024-04-04 MED ORDER — SODIUM CHLORIDE 0.9 % IV SOLN
0.9 | INTRAVENOUS | Status: DC
Start: 2024-04-04 — End: 2024-04-04
  Administered 2024-04-04: 02:00:00 via INTRAVENOUS

## 2024-04-04 MED ORDER — ACETAMINOPHEN 325 MG PO TABS
325 | Freq: Four times a day (QID) | ORAL | Status: DC | PRN
Start: 2024-04-04 — End: 2024-04-05

## 2024-04-04 MED ORDER — POTASSIUM BICARB-CITRIC ACID 20 MEQ PO TBEF
20 | ORAL | Status: DC | PRN
Start: 2024-04-04 — End: 2024-04-05

## 2024-04-04 MED ORDER — MORPHINE SULFATE (PF) 4 MG/ML IJ SOLN
4 | INTRAMUSCULAR | Status: DC | PRN
Start: 2024-04-04 — End: 2024-04-05

## 2024-04-04 MED ORDER — POLYETHYLENE GLYCOL 3350 17 G PO PACK
17 | Freq: Every day | ORAL | Status: DC | PRN
Start: 2024-04-04 — End: 2024-04-03

## 2024-04-04 MED FILL — PIPERACILLIN SOD-TAZOBACTAM SO 4.5 (4-0.5) G IV SOLR: 4.5 (4-0.5) g | INTRAVENOUS | Qty: 4500 | Fill #0

## 2024-04-04 MED FILL — MELATONIN MAXIMUM STRENGTH 5 MG PO TABS: 5 mg | ORAL | Qty: 1 | Fill #0

## 2024-04-04 MED FILL — UNASYN 3 (2-1) G IJ SOLR: 3 (2-1) g | INTRAMUSCULAR | Qty: 3000 | Fill #0

## 2024-04-04 MED FILL — TYLENOL 325 MG PO TABS: 325 mg | ORAL | Qty: 2 | Fill #0

## 2024-04-04 MED FILL — PROTONIX 40 MG IV SOLR: 40 mg | INTRAVENOUS | Qty: 40 | Fill #0

## 2024-04-04 MED FILL — ONDANSETRON 4 MG PO TBDP: 4 mg | ORAL | Qty: 1 | Fill #0

## 2024-04-04 MED FILL — POTASSIUM CHLORIDE 10 MEQ/100ML IV SOLN: 10 MEQ/0ML | INTRAVENOUS | Qty: 100 | Fill #0

## 2024-04-04 MED FILL — LOPERAMIDE HCL 2 MG PO CAPS: 2 mg | ORAL | Qty: 1 | Fill #0

## 2024-04-04 MED FILL — POTASSIUM CHLORIDE CRYS ER 20 MEQ PO TBCR: 20 meq | ORAL | Qty: 1 | Fill #0

## 2024-04-04 MED FILL — EFFER-K 20 MEQ PO TBEF: 20 meq | ORAL | Qty: 1 | Fill #0

## 2024-04-04 MED FILL — POTASSIUM CHLORIDE CRYS ER 20 MEQ PO TBCR: 20 meq | ORAL | Qty: 2 | Fill #0

## 2024-04-04 NOTE — Progress Notes (Signed)
 "[facility] Hospitalist Service    HOSPITALIST PROGRESS NOTE      Date:04/04/2024        Room:3119/01  Patient Name:Mia Roy       Date of Birth:1945-09-04       Age:78 y.o.    Subjective   Patient denies bleeding since coughing out a blood clot yesterday after she was discharged from the ER.     Medications   Scheduled Meds:    sodium chloride  flush  5-40 mL IntraVENous 2 times per day    pantoprazole   40 mg IntraVENous BID    piperacillin -tazobactam  4,500 mg IntraVENous Q8H     Continuous Infusions:    sodium chloride       sodium chloride  75 mL/hr at 04/03/24 2158     PRN Meds: sodium chloride  flush, sodium chloride , potassium chloride  **OR** potassium alternative oral replacement **OR** potassium chloride , magnesium  sulfate, ondansetron  **OR** ondansetron , polyethylene glycol, acetaminophen  **OR** acetaminophen , acetaminophen , aluminum & magnesium  hydroxide-simethicone , bisacodyl , guaiFENesin -dextromethorphan, hydrALAZINE , HYDROcodone  5 mg - acetaminophen , melatonin, morphine , ondansetron       Physical Examination      Vitals:  BP (!) 129/58   Pulse 60   Temp 98.2 F (36.8 C) (Oral)   Resp 20   Ht 1.575 m (5' 2)   Wt 61.2 kg (135 lb)   SpO2 95%   BMI 24.69 kg/m   Temp (24hrs), Avg:98.5 F (36.9 C), Min:98.1 F (36.7 C), Max:98.8 F (37.1 C)      I/O (24Hr):    Intake/Output Summary (Last 24 hours) at 04/04/2024 1535  Last data filed at 04/04/2024 0419  Gross per 24 hour   Intake 586.25 ml   Output 300 ml   Net 286.25 ml       CONSTITUTIONAL:  awake, alert, cooperative, no apparent distress  LUNGS:  No increased work of breathing, clear to auscultation,  CARDIOVASCULAR:  regular rate and rhythm, no audible murmurs  ABDOMEN:  soft, non-tender, non-distended   EXTREMITIES:  no lower extremity edema  NEUROLOGIC:  no focal deficits  PSYCH: Cooperative    Labs/Imaging/Diagnostics   Labs:  CBC:  Recent Labs     04/03/24  1120 04/04/24  0535   WBC 10.8* 4.8   RBC 4.01 3.23*   HGB 12.5 10.1*   HCT 37.9 31.7*    MCV 94.5 98.1   RDW 13.6 14.5   PLT 233 181     CHEMISTRIES:  Recent Labs     04/03/24  1120 04/04/24  0535   NA 140 144   K 3.5 3.0*   CL 99 107   CO2 29 28   BUN 22 20   CREATININE 0.3* 0.4*   GLUCOSE 147* 98   MG  --  1.9     PT/INR:  Recent Labs     04/03/24  1802 04/04/24  0535   PROTIME 22.4* 21.0*   INR 2.0 1.8     APTT:No results for input(s): APTT in the last 72 hours.  LIVER PROFILE:  Recent Labs     04/03/24  1120 04/04/24  0535   AST 29 29   ALT 16 19   BILITOT 0.67 0.55   ALKPHOS 80 64       Imaging Last 24 Hours:  CTA Chest W/WO Contrast PE Eval  Result Date: 04/03/2024  CTA chest: 04/03/24 INDICATION:  hemoptysis COMPARISON: Same-day chest radiograph and CT abdomen TECHNIQUE: PE protocol (Imaging from the thoracic inlet through the hemidiaphragms following contrast in the pulmonary  arterial phase. Axial soft tissue and lung 5 x 5 mm images. Additional multiplanar 3-D volumetric MIP reconstructions through the pulmonary arteries were generated and reviewed per protocol.) CT scanning was performed using radiation dose reduction techniques when appropriate, per system protocols. FINDINGS: Mediastinum And Related Regions: Heart: Moderately enlarged heart. Cardiac ICD present.   No pericardial effusion.   Coronary calcium: Mild Vascular:  No aortic aneurysm or dissection.     No pulmonary embolism identified to the subsegmental level.  Pulmonary artery normal caliber. Other: No enlarged lymph nodes.   Airways, Lungs, Pleura: Trachea and central airways are clear. Mild basilar atelectasis, additional mild  bibasilar groundglass patchy opacities which may be due to very mild pulmonary edema or infection. No dense consolidation. No worrisome solitary appearing pulmonary nodule.   No pleural effusion.   No pneumothorax.  Upper Abdomen: Unremarkable upper abdomen.  Bones: No acute or aggressive bony abnormality.  Additional findings, if any: None.      No pulmonary embolism.  Enlarged heart. Mild bibasilar  groundglass patchy opacities which may be due to very mild pulmonary edema or infection, or alternatively small amount of pulmonary hemorrhage. .    XR CHEST (2 VW)  Result Date: 04/03/2024  Chest PA and lateral: 04/03/24 INDICATION: right opacities on CT abd/pelvis COMPARISON: Same-day CT abdomen. 05/25/2018 chest radiograph FINDINGS: Stable mildly enlarged cardiac silhouette, cardiac ICD present. Mild diffuse interstitial opacities. The lungs, pleura, heart, and mediastinal contours are otherwise unremarkable. No acute bony abnormality.     Enlarged right axilla. Mild diffuse interstitial opacities in the lungs may be due to pulmonary vascular congestion without overt edema, or interstitial pneumonia.    CT ABDOMEN PELVIS W IV CONTRAST Additional Contrast? None  Result Date: 04/03/2024  CT abdomen pelvis with contrast: 04/03/24 INDICATION: vomiting, generalized abd pain COMPARISON: August 2022 TECHNIQUE: Routine protocol (Axial portal venous phase imaging from the lung bases to the pubic symphysis following IV contrast with coronal reconstruction.)  CT scanning was performed using radiation dose reduction techniques when appropriate, per system protocols. FINDINGS: Lung bases: Cardiomegaly with severe biatrial enlargement. No pericardial effusion. Mild patchy airspace opacities in the right greater than left lung bases. No dense consolidation. No pleural effusion. Liver: No worrisome hepatic lesion. Likely a few scattered small shunts present in the subcapsular right lobe. Gallbladder/biliary: Unremarkable. No evidence of biliary obstruction. Pancreas: Unremarkable. Spleen: Unremarkable. Adrenal Glands: Unremarkable. Kidneys: No hydronephrosis.. Small cyst in the left kidney. Gastrointestinal Tract: No evidence of obstruction. Normal appendix. A few uncomplicated colonic diverticula are present. Vasculature: No abdominal aortic aneurysm. Lymph Nodes: No lymphadenopathy. Peritoneum: No free fluid or gas. Bladder:  Unremarkable. Pelvic Reproductive Organs: Unremarkable. Osseous Structures/Soft tissues: Degenerative changes of the spine and pelvis with no suspicious lytic or blastic osseous lesions. Mild grade 1 anterolisthesis of L4 on L5, likely degenerative with no pars defect. Degenerative disc disease here is severe.     No acute process in the abdomen or pelvis. Mild degree of patchy airspace opacities in the right greater than left lung bases suspicious for pneumonia        Assessment and Plan        Hospital Problems           Last Modified POA    * (Principal) GI bleed 04/03/2024 Yes     Hemoptysis:   Suspect due to possible mallory weiss tear after episodes of vomiting.  Hold coumadin for now and monitor  She can resume coumadin upon discharge if no further  bleeding episodes    Nausea/Vomiting  Suspect due to gastroenteritis and has improved  Episode of bleeding likely a MW tear  Continue IV protonix     Pneumonia:   Suspect aspration PNA from multiple episodes of vomiting  Will switch to Unasyn  and then augmentin  upon discharge    Atrial fibrillation:   Patient is on coumadin at home which I expect she can resume once no more bleeding reported    Hypokalemia:   Replace and monitor    DVT ppx: SCDs      Alonso Gustin, MD  Hospitalist Service   "

## 2024-04-04 NOTE — Consults (Signed)
 "            Consult Note            Date:04/04/2024        Patient Name:Mia Roy     Date of Birth:1946/03/20     Age:78 y.o.    Reason for Consult: Hemoptysis    Chief Complaint     Chief Complaint   Patient presents with    Hemoptysis     Pt ambulatory to triage c/o coughing up a bright red blood clot after being discharged from ER 1 hour ago. Pt is on coumadin           History Obtained From   Patient, medical record, medical staff    History of Present Illness   78 year old female with history of chronic atrial fibrillation on baby aspirin and Coumadin chronically.  She has no known prior history of pulmonary disease or tobacco use.  She presented to the emergency room yesterday morning with nausea, vomiting, abdominal pain, diarrhea.  She stated a few bright red blood streaks in her vomit prior to arrival to the ER.  Abdominal CT at that time showed no acute intra-abdominal process with some patchy bilateral lower lobe ground glass opacities.  She was not anemic.  She was discharged home with Augmentin  for possible pneumonia along with Zofran  and Bentyl .  Patient subsequently noticed some hemoptysis later in the day so she Re presented yesterday afternoon to the emergency room.  Chest CT at that time showed no pulmonary embolism and confirmed bilateral lower lobe opacities.  INR was at goal of 2.0.  Patient was admitted to the hospitalist service for further evaluation and treatment.  He was placed on Zosyn  for pneumonia coverage and IV fluids given.  Cultures were ordered.  Warfarin was held but not reversed.  Since admission the patient has been afebrile, normotensive, on room air    Upon speaking with the patient today she states she had significant retching and emesis throughout the night Friday night and developed some bright red blood streaks in her vomitus early yesterday morning.  She has had no further nausea or vomiting or hematemesis since admission.  She is tolerating liquids this morning thus  far.  She continues to cough up some dark blood as recently as about an hour ago.  She denies any epistaxis.  She denies any hematuria.  She reports no fevers, no chills, no night sweats, no seizures, no syncope, no vision changes, no chest pain, no purulent sputum, no wheezing, no dyspnea, no lower GI bleeding, no focal weakness, no sensory changes, no arthralgias, no rashes, no palpable lymphadenopathy    Past Medical History   Atrial fibrillation, hypertension, muscular dystrophy, insomnia, hyperlipidemia, cardiomyopathy, TIA, mild aortic stenosis    Past Surgical History   ICD/pacer, tonsillectomy, C-section, fibroid removal    Medications     Prior to Admission medications   Medication Sig Start Date End Date Taking? Authorizing Provider   amoxicillin -clavulanate (AUGMENTIN ) 875-125 MG per tablet Take 1 tablet by mouth 2 times daily for 7 days 04/03/24 04/10/24 Yes Gladis, Madison A, PA-C   ondansetron  (ZOFRAN -ODT) 4 MG disintegrating tablet Take 1 tablet by mouth 4 times daily as needed for Nausea or Vomiting 04/03/24 04/13/24 Yes Gladis, Madison A, PA-C   dicyclomine  (BENTYL ) 20 MG tablet Take 1 tablet by mouth every 6 hours as needed (Abdominal Pain) 04/03/24  Yes Gladis, Madison A, PA-C   traZODone  (DESYREL ) 50 MG tablet Take 2 tablets  by mouth nightly   Yes [provider]   warfarin (COUMADIN) 5 MG tablet Take 1 tablet by mouth daily Take 1.5 tablets or 7.5 mg daily   Yes [provider]   hydroCHLOROthiazide (HYDRODIURIL) 25 MG tablet Take 1 tablet by mouth daily 09/03/23  Yes [provider]        potassium chloride  10 mEq/100 mL IVPB (Peripheral Line), Q1H  potassium bicarb-citric acid  (EFFER-K ) effervescent tablet 20 mEq, Once  sodium chloride  flush 0.9 % injection 5-40 mL, 2 times per day  sodium chloride  flush 0.9 % injection 5-40 mL, PRN  0.9 % sodium chloride  infusion, PRN  potassium chloride  (KLOR-CON  M) extended release tablet 40 mEq, PRN   Or  potassium bicarb-citric  acid (EFFER-K ) effervescent tablet 40 mEq, PRN   Or  potassium chloride  10 mEq/100 mL IVPB (Peripheral Line), PRN  magnesium  sulfate 2000 mg in water 50 mL IVPB, PRN  ondansetron  (ZOFRAN -ODT) disintegrating tablet 4 mg, Q8H PRN   Or  ondansetron  (ZOFRAN ) injection 4 mg, Q6H PRN  polyethylene glycol (GLYCOLAX ) packet 17 g, Daily PRN  acetaminophen  (TYLENOL ) tablet 650 mg, Q6H PRN   Or  acetaminophen  (TYLENOL ) suppository 650 mg, Q6H PRN  0.9 % sodium chloride  infusion, Continuous  pantoprazole  (PROTONIX ) injection 40 mg, BID  acetaminophen  (TYLENOL ) tablet 650 mg, Q6H PRN  aluminum & magnesium  hydroxide-simethicone  (MAALOX PLUS) 200-200-20 MG/5ML suspension 30 mL, Q6H PRN  bisacodyl  (DULCOLAX) EC tablet 5 mg, Daily PRN  guaiFENesin -dextromethorphan (ROBITUSSIN DM) 100-10 MG/5ML syrup 5 mL, Q4H PRN  hydrALAZINE  (APRESOLINE ) injection 10 mg, Q6H PRN  HYDROcodone -acetaminophen  (NORCO) 5-325 MG per tablet 1 tablet, Q6H PRN  melatonin tablet 5 mg, Nightly PRN  morphine  sulfate (PF) injection 1 mg, Q4H PRN  ondansetron  (ZOFRAN ) injection 4 mg, Q6H PRN  piperacillin -tazobactam (ZOSYN ) 4,500 mg in sodium chloride  0.9 % 100 mL IVPB (mini-bag), Q8H        Allergies   Clarithromycin    Social History     Social History       Tobacco History       Smoking Status  Never      Smokeless Tobacco Use  Never              Alcohol History       Alcohol Use Status  Never              Drug Use       Drug Use Status  Never              Sexual Activity       Sexually Active  Not Asked                    Family History   Mother with hypertension, stroke, dementia.  Father with ischemic heart disease    Review of Systems   Refer to HPI    Physical Exam   BP 128/61   Pulse 69   Temp 98.8 F (37.1 C) (Oral)   Resp 20   Ht 1.575 m (5' 2)   Wt 61.2 kg (135 lb)   SpO2 95%   BMI 24.69 kg/m     General: Alert, cooperative, no obvious distress  Neck: supple, no thyroidmegaly  Lungs: Scant crackles in bases, non-labored respirations, no  wheezing  Heart: Normal rate, regular rhythm, no murmur  Musculoskeletal: Warm, well perfused, no joint swelling or tenderness  Abdomen: Soft, non-tender, non-distended, normal bowel sounds  Neurologic: No focal weakness, CN  II-XII intact, no seizures, follows commands  Extremities: no cyanosis, no edema, no clubbing, no rashes    Labs    CBC:  Recent Labs     04/03/24  1120 04/04/24  0535   WBC 10.8* 4.8   RBC 4.01 3.23*   HGB 12.5 10.1*   HCT 37.9 31.7*   MCV 94.5 98.1   RDW 13.6 14.5   PLT 233 181     CHEMISTRIES:  Recent Labs     04/03/24  1120 04/04/24  0535   NA 140 144   K 3.5 3.0*   CL 99 107   CO2 29 28   BUN 22 20   CREATININE 0.3* 0.4*   GLUCOSE 147* 98   MG  --  1.9     PT/INR:  Recent Labs     04/03/24  1802 04/04/24  0535   PROTIME 22.4* 21.0*   INR 2.0 1.8     APTT:No results for input(s): APTT in the last 72 hours.  LIVER PROFILE:  Recent Labs     04/03/24  1120 04/04/24  0535   AST 29 29   ALT 16 19   BILITOT 0.67 0.55   ALKPHOS 80 64     Procalcitonin normal  Blood cultures pending  Urine Legionella antigen pending  Urine strep antigen pending  C. difficile toxin pending    Imaging/Diagnostics   CTA Chest W/WO Contrast PE Eval  Result Date: 04/03/2024  No pulmonary embolism.  Enlarged heart. Mild bibasilar groundglass patchy opacities which may be due to very mild pulmonary edema or infection, or alternatively small amount of pulmonary hemorrhage. .    XR CHEST (2 VW)  Result Date: 04/03/2024  Enlarged right axilla. Mild diffuse interstitial opacities in the lungs may be due to pulmonary vascular congestion without overt edema, or interstitial pneumonia.    CT ABDOMEN PELVIS W IV CONTRAST Additional Contrast? None  Result Date: 04/03/2024  No acute process in the abdomen or pelvis. Mild degree of patchy airspace opacities in the right greater than left lung bases suspicious for pneumonia    I personally reviewed the patient's chest CT and there was no evidence of pulmonary embolism, airway  lesions, bronchiectasis, masses, foreign body.  There is very mild patchy ground glass opacities in the bilateral lower lobes  Assessment      Hospital Problems           Last Modified POA    * (Principal) GI bleed 04/03/2024 Yes       Plan     Hemoptysis: Non-massive; chest CT does not show any concerning parenchymal or airway lesions that would cause hemoptysis.  Suspect her hemoptysis may be related to aspirated hematemesis or mild bleeding from pneumonia.  No indication for bronchoscopy currently.  Could likely de-escalate to Unasyn /Augmentin .  Since Coumadin is prophylactic, agree with holding it until hemoptysis improves (no need for active reversal)  Pneumonia: Suspect possible aspiration; could likely de-escalate to Unasyn /Augmentin ; follow-up cultures and urine antigens; consider follow-up chest imaging in 4 to 6 weeks  Goals of care: Full code    Electronically signed by SELINDA JONETTA MEIERS, MD on 04/04/24 at 8:13 AM EDT   "

## 2024-04-04 NOTE — Consults (Addendum)
 "  Gastroenterology Consult Note   Admit Date: 04/03/2024; ;  LOS: 0 days    Date of Service: 04/04/2024  Referring Physician: Leonel Ozell BROCKS, DO  Chief complaint: Hemoptysis    REASON FOR CONSULT:   Medical evaluation and recommendations for upper GI bleed    HISTORY OF PRESENT ILLNESS:   Mia Roy is a 78 y.o. female with history of atrial fibrillation on warfarin, cardiomyopathy with internal cardiac defibrillator and muscular dystrophy presents to [facility] with coughing up blood.  Patient presented to the ED yesterday for acute onset of vomiting, diarrhea and abdominal pain at that time patient was diagnosed with gastroenteritis and pneumonia and was discharged home with Augmentin .  Patient returns back to the ED after getting in the shower and had a episode of coughing where she coughed up a blood clot.    Patient reports her symptomology started acutely yesterday when she was trying to sleep.  Patient endorses she had several episodes of clear emesis followed by an emesis with a few streaks of bright red blood.  Denies coffee-ground emesis or reoccurrence.  Patient denies history of known gastrointestinal bleed recent alcohol or NSAID abuse.  Of note patient does take Aleve nightly for her muscular dystrophy pain, however denies any recent increase in use.  Patient reports her abdominal pain is diffuse and more of a soreness due to her frequent episodes of vomiting which she reported occurred approximately every 20 minutes but now has resolved.  Additionally patient reports loose stool with approximately 12 occurrences at home which has now slowed down described as small-volume brown in coloration without presence of mucus or blood.  Denies recent antibiotic use, PPI or recent steroid use.  No recent weight loss, chest pain or shortness of breath    Patient previously established with Dr. Lael with [facility] for her screening colonoscopies however due to her having a pacemaker  with defibrillator she was referred to [facility] which she has not done yet.    GI consulted for GI bleed.  Lab work notable for  Hemoglobin 12.5 on arrival downtrending to 10 point MCV 98, BUN 28 creatinine 0.4 chemistry grossly unremarkable with exception of potassium 3.0.  No leukocytosis.  INR 2.0.  Lactic acid initially 2.1 improving with IV fluids CT of the abdomen pelvis with contrast  without any acute intra-abdominal processes does note mild degree of patchy airspace in the right lung suspicious for pneumonia.  There is no gastrointestinal obstruction a few uncomplicated colonic diverticula present.  CTA of the chest with no pulmonary embolism noting cardiomegaly and mild bibasilar ground class patchy opaquities.     History obtained by the patient.     REVIEW OF SYSTEMS:   Pertinent positives are mentioned in the HPI.   Otherwise, all other ROS reviewed and negative.    PAST MEDICAL HISTORY:   No past medical history on file.     PAST SURGICAL HISTORY:   No past surgical history on file.     FAMILY HISTORY:   No family history on file.  No family history of GI malignancy, or liver disease.    SOCIAL HISTORY:     Social History     Substance and Sexual Activity   Drug Use Never     Alcohol Use: Not At Risk (04/03/2024)    AUDIT-C     Frequency of Alcohol Consumption: Never     Average Number of Drinks: Patient does not drink     Frequency  of Binge Drinking: Never     Tobacco Use: Low Risk  (04/03/2024)    Patient History     Smoking Tobacco Use: Never     Smokeless Tobacco Use: Never     Passive Exposure: Not on file       PHYSICAL EXAM:   Vitals (last):  BP 126/65   Pulse 60   Temp 98.1 F (36.7 C) (Oral)   Resp 20   Ht 1.575 m (5' 2)   Wt 61.2 kg (135 lb)   SpO2 93%   BMI 24.69 kg/m     Vitals (min/max last 24 hrs):  Temp  Min: 98.1 F (36.7 C)  Max: 98.8 F (37.1 C)  Pulse  Min: 60  Max: 82  BP  Min: 111/75  Max: 148/72  SpO2  Min: 90 %  Max: 97 %  Resp  Min: 17  Max: 20     Physical  Exam:  General:  Awake, alert x3, cooperative nontoxic-appearing female  Eyes:  PERRLA, no scleral icterus  HEENT:  Normocephalic, atraumatic. Oral mucosa moist and pink. No nasal discharge  Neck:  Supple, trachea midline  Heart:  no murmurs, gallops, rubs  Lungs:  no increased WOB, no wheeze, rales, or rhonchi  Abdomen:  Soft, mild diffuse TTP on deep palpation, nondistended, no masses, no peritoneal signs, no guarding, rigidity, or rebound tenderness  Extremities:  No clubbing, cyanosis, or edema  Skin:  Warm, dry, and pink. no rashes or lesions noted  Neuro:  No focal deficits, alert and oriented     HOME MEDICATIONS:   Medications Prior to Admission: amoxicillin -clavulanate (AUGMENTIN ) 875-125 MG per tablet, Take 1 tablet by mouth 2 times daily for 7 days  ondansetron  (ZOFRAN -ODT) 4 MG disintegrating tablet, Take 1 tablet by mouth 4 times daily as needed for Nausea or Vomiting  dicyclomine  (BENTYL ) 20 MG tablet, Take 1 tablet by mouth every 6 hours as needed (Abdominal Pain)  traZODone  (DESYREL ) 50 MG tablet, Take 2 tablets by mouth nightly  warfarin (COUMADIN) 5 MG tablet, Take 1 tablet by mouth daily Take 1.5 tablets or 7.5 mg daily  hydroCHLOROthiazide (HYDRODIURIL) 25 MG tablet, Take 1 tablet by mouth daily    INPATIENT MEDICATIONS:   Scheduled Medications:  potassium chloride , 20 mEq, Once  sodium chloride  flush, 5-40 mL, 2 times per day  pantoprazole , 40 mg, BID  piperacillin -tazobactam, 4,500 mg, Q8H       Continuous Medications:  sodium chloride   sodium chloride , Last Rate: 75 mL/hr at 04/03/24 2158      PRN Medications;  sodium chloride  flush, 5-40 mL, PRN  sodium chloride , , PRN  potassium chloride , 40 mEq, PRN   Or  potassium alternative oral replacement, 40 mEq, PRN   Or  potassium chloride , 10 mEq, PRN  magnesium  sulfate, 2,000 mg, PRN  ondansetron , 4 mg, Q8H PRN   Or  ondansetron , 4 mg, Q6H PRN  polyethylene glycol, 17 g, Daily PRN  acetaminophen , 650 mg, Q6H PRN   Or  acetaminophen , 650 mg, Q6H  PRN  acetaminophen , 650 mg, Q6H PRN  aluminum & magnesium  hydroxide-simethicone , 30 mL, Q6H PRN  bisacodyl , 5 mg, Daily PRN  guaiFENesin -dextromethorphan, 5 mL, Q4H PRN  hydrALAZINE , 10 mg, Q6H PRN  HYDROcodone  5 mg - acetaminophen , 1 tablet, Q6H PRN  melatonin, 5 mg, Nightly PRN  morphine , 1 mg, Q4H PRN  ondansetron , 4 mg, Q6H PRN        LABS AND STUDIES (reviewed):   Labs (reviewed):  CBC  Recent Labs     04/03/24  1120 04/04/24  0535   WBC 10.8* 4.8   HGB 12.5 10.1*   HCT 37.9 31.7*   PLT 233 181      BMP  Recent Labs     04/03/24  1120 04/04/24  0535   NA 140 144   K 3.5 3.0*   CL 99 107   CO2 29 28   BUN 22 20   MG  --  1.9     LFT  Recent Labs     04/03/24  1120 04/04/24  0535   BILITOT 0.67 0.55   ALT 16 19   AST 29 29   ALKPHOS 80 64     Coags  Recent Labs     04/03/24  1802 04/04/24  0535   PROTIME 22.4* 21.0*   INR 2.0 1.8     I&O's  In: 586.3   Out: 300 [Urine:300]  Imaging/Studies/ECGs (reviewed):  CTA Chest W/WO Contrast PE Eval  Result Date: 04/03/2024  No pulmonary embolism.  Enlarged heart. Mild bibasilar groundglass patchy opacities which may be due to very mild pulmonary edema or infection, or alternatively small amount of pulmonary hemorrhage. .    XR CHEST (2 VW)  Result Date: 04/03/2024  Enlarged right axilla. Mild diffuse interstitial opacities in the lungs may be due to pulmonary vascular congestion without overt edema, or interstitial pneumonia.    CT ABDOMEN PELVIS W IV CONTRAST Additional Contrast? None  Result Date: 04/03/2024  No acute process in the abdomen or pelvis. Mild degree of patchy airspace opacities in the right greater than left lung bases suspicious for pneumonia      ASSESSMENT/PLAN:     Hospital Problems           Last Modified POA    * (Principal) GI bleed 04/03/2024 Yes     Mia Roy is a 78 y.o. female PMHX of A-fib on warfarin presents with hemoptysis, N/V/D in which the GI team was consulted for.  Imaging without acute process lab work unrevealing.  Suspect  gastroenteritis leading to nausea vomiting with a Mallory-Weiss tear versus ENT pulmonary bleeding mimicker.  Aspiration PNA possible given reoccurrence of her vomiting which has now resolved. PUD RF with NSAIDs, however BUN no suspicion for acute UGIB. HDS and VSS.     Nausea Vomiting Diarrhea  Hemoptysis  Long-term anticoagulation use  - Trend hgb and stool pattern, transfuse blood products prn to maintain hgb > 7  - Hold warfarin, if no evidence of further bleeding can resume in the a.m. from a GI standpoint  - CDT and stool culture  - No indication for endoscopic evaluation at this time.  Can reconsider if change in clinical status.  - Avoid NSAIDs, consider other multi modality pain management  - At time of discharge please provide patient with Phenergan suppositories as needed  - Pulmonology team following no current indication for bronchoscope    GI will continue to follow.  Discussed with primary team, Dr. Corena if hemoglobin stable in the a.m. can consider discharge readiness from a GI standpoint.  Patient to follow-up with [facility] for her overdue screening colonoscopy.    Thank you for allowing us  to participate in your patient's care. Assessment and plan discussed with Dr. Ladean.    Signed:   Kevin CHRISTELLA Boas, PA-C  04/04/2024 11:46 AM   "

## 2024-04-04 NOTE — Plan of Care (Signed)
"    Problem: Discharge Planning  Goal: Discharge to home or other facility with appropriate resources  Outcome: Progressing  Flowsheets (Taken 04/04/2024 0840 by Helen Been, RN)  Discharge to home or other facility with appropriate resources:   Identify barriers to discharge with patient and caregiver   Arrange for needed discharge resources and transportation as appropriate   Identify discharge learning needs (meds, wound care, etc)   Refer to discharge planning if patient needs post-hospital services based on physician order or complex needs related to functional status, cognitive ability or social support system   Arrange for interpreters to assist at discharge as needed     Problem: Safety - Adult  Goal: Free from fall injury  Outcome: Progressing     Problem: ABCDS Injury Assessment  Goal: Absence of physical injury  Outcome: Progressing     Problem: Pain  Goal: Verbalizes/displays adequate comfort level or baseline comfort level  Outcome: Progressing     "

## 2024-04-04 NOTE — Care Coordination (Signed)
"  Date: 04/04/24 Time 1725      Initial assessment completed on  Chantille Navarrete via IN PERSON to determine transition of care needs. CM also spoke with the patient and spouse to discuss discharge needs.     Pt currently hospitalized for GI bleed [K92.2]  Hemoptysis [R04.2].   Patient lives with: (P) Spouse   Type of Home: (P) House     Current services prior to admission: (P) Durable Medical Equipment    Current DME: (P) Cane      Pt fills medications at   [facility] [address] [place], [place] - [address] - P [phone] [place] [place] [phone]  [address]  [place] [address]  Phone: [phone] Fax: [phone]      Discussed discharge options with the patient and spouse and they would like to consider the following discharge plan: Home         Case Manager will follow to finalize plans and work to transition patient to the next level of care.       Completed by: Duwaine Dickens BSN, RN    Case Management Department        04/04/24 1802   Service Assessment   Information Provided By Patient;Medical Record;Spouse   Patient Orientation Alert;Oriented   Cognition No Apparent Deficit   Primary Caregiver Self   Support Systems Spouse/Significant Other;Children   PCP Verified by CM Yes  (Blessing-Feussner, Niels CROME, [place])   Prior Functional Level Independent in ADLs/IADLs   History of falls? 0   Can patient return to prior living arrangement Yes   Ability to make needs known: Good   Family able to assist with home care needs: Yes   Other than yourself, who should be included in your transition plan for discharge from the hospital? Rubey,Carl (Spouse)  [phone]   Receives Help From Family   Social/Functional History   Prior Level of Assist for ADLs Independent   Prior Level of Assist for [employer] Independent   Prior Level of Assist for Transfers Independent   Ambulation Assistance Independent   Active Driver No   Patient's Driver Info Arneson,Carl (Spouse)  [phone]   Occupation Retired    Building Control Surveyor    Location Prior to Acute Admission House   Lives With Spouse   Current Services Prior To Admission Durable Medical Equipment   Current DME Prior to [employer] Needed N/A   Potential Assistance Obtaining Medications No   Patient expects to be discharged to: Home   Home Layout One level   Entrance Stairs - Number of Steps 1   Services At/After Discharge   Who will provide transportation at discharge? Family  (Leider,Carl (Spouse)  [phone])       "

## 2024-04-04 NOTE — Plan of Care (Signed)
"    Problem: Discharge Planning  Goal: Discharge to home or other facility with appropriate resources  04/04/2024 0740 by Helen Been, RN  Outcome: Progressing  04/03/2024 2231 by Helga Bernardino CROME, RN  Outcome: Progressing  Flowsheets (Taken 04/03/2024 2100)  Discharge to home or other facility with appropriate resources: Identify barriers to discharge with patient and caregiver     Problem: Safety - Adult  Goal: Free from fall injury  04/04/2024 0740 by Helen Been, RN  Outcome: Progressing  04/03/2024 2231 by Helga Bernardino CROME, RN  Outcome: Progressing     Problem: ABCDS Injury Assessment  Goal: Absence of physical injury  04/04/2024 0740 by Helen Been, RN  Outcome: Progressing  04/03/2024 2231 by Helga Bernardino CROME, RN  Outcome: Progressing     "

## 2024-04-05 LAB — CBC WITH AUTO DIFFERENTIAL
Basophils Absolute: 0 x10e3/mcL (ref 0.0–0.2)
Eosinophils Absolute: 0 x10e3/mcL (ref 0.0–0.5)
Hematocrit: 30.3 % — ABNORMAL LOW (ref 34.0–47.0)
Hemoglobin: 9.7 g/dL — ABNORMAL LOW (ref 11.5–15.7)
Immature Grans (Abs): 0.01 x10e3/mcL (ref 0.00–0.06)
Immature Granulocytes %: 0.2 % (ref 0.0–0.6)
Lymphocytes Absolute: 1.2 x10e3/mcL (ref 1.0–3.2)
MCH: 31.7 pg (ref 27.0–34.5)
MCHC: 32 g/dL (ref 30.0–36.0)
MCV: 99 fL (ref 81.0–99.0)
MPV: 9 fL (ref 7.0–12.2)
Monocytes Absolute: 0.6 x10e3/mcL (ref 0.3–1.0)
Neutrophils Absolute: 3.2 x10e3/mcL (ref 1.6–7.3)
Platelets: 171 x10e3/mcL (ref 140–440)
RBC: 3.06 x10e6/mcL — ABNORMAL LOW (ref 3.60–5.20)
WBC: 5 x10e3/mcL (ref 3.8–10.6)

## 2024-04-05 LAB — CULTURE, BLOOD 2

## 2024-04-05 LAB — CULTURE, BLOOD 1

## 2024-04-05 LAB — BASIC METABOLIC PANEL W/ REFLEX TO MG FOR LOW K
Est, Glom Filt Rate: 110 mL/min/1.73mÂ² (ref >=60)
Osmolaliy Calculated: 281 mosm/kg (ref 270–287)

## 2024-04-05 MED ORDER — PROCHLORPERAZINE EDISYLATE 10 MG/2ML IJ SOLN
10 | Freq: Four times a day (QID) | INTRAMUSCULAR | Status: DC | PRN
Start: 2024-04-05 — End: 2024-04-05
  Administered 2024-04-05: 03:00:00 10 mg via INTRAVENOUS

## 2024-04-05 MED ORDER — LOPERAMIDE HCL 2 MG PO CAPS
2 | ORAL_CAPSULE | Freq: Four times a day (QID) | ORAL | 0 refills | Status: AC | PRN
Start: 2024-04-05 — End: 2024-04-10

## 2024-04-05 MED ORDER — AMOXICILLIN-POT CLAVULANATE 875-125 MG PO TABS
875-125 | ORAL_TABLET | Freq: Two times a day (BID) | ORAL | 0 refills | Status: AC
Start: 2024-04-05 — End: 2024-04-10

## 2024-04-05 MED FILL — PROTONIX 40 MG IV SOLR: 40 mg | INTRAVENOUS | Qty: 40 | Fill #0

## 2024-04-05 MED FILL — MI-ACID GAS RELIEF 80 MG PO CHEW: 80 mg | ORAL | Qty: 1 | Fill #0

## 2024-04-05 MED FILL — LOPERAMIDE HCL 2 MG PO CAPS: 2 mg | ORAL | Qty: 1 | Fill #0

## 2024-04-05 MED FILL — TRAZODONE HCL 50 MG PO TABS: 50 mg | ORAL | Qty: 2 | Fill #0

## 2024-04-05 MED FILL — PROCHLORPERAZINE EDISYLATE 10 MG/2ML IJ SOLN: 10 MG/2ML | INTRAMUSCULAR | Qty: 2 | Fill #0

## 2024-04-05 MED FILL — AMOXICILLIN-POT CLAVULANATE 875-125 MG PO TABS: 875-125 mg | ORAL | Qty: 1

## 2024-04-05 MED FILL — LOPERAMIDE HCL 2 MG PO CAPS: 2 mg | ORAL | Qty: 1

## 2024-04-05 MED FILL — AMOXICILLIN-POT CLAVULANATE 875-125 MG PO TABS: 875-125 mg | ORAL | Qty: 1 | Fill #0

## 2024-04-05 MED FILL — PROTONIX 40 MG IV SOLR: 40 mg | INTRAVENOUS | Qty: 40

## 2024-04-05 NOTE — Progress Notes (Signed)
 "    Pulmonary Progress Note    Date:04/05/2024       Room:3119/01  Patient Name:Mia Roy     Date of Birth:1946/01/20     Age:78 y.o.    Subjective   Interval History Status: Hemoptysis has resolved.  None since yesterday around noon time.  Getting better before that.  On room air.  Slept well last night.  No cough or mucus or sputum.  No shortness of breath.  900 mL urine output.  On room air.  Infectious workup negative thus far.    Review of Systems   ROS as above    Medications   Scheduled Meds:    traZODone   100 mg Oral Nightly    amoxicillin -clavulanate  1 tablet Oral 2 times per day    sodium chloride  flush  5-40 mL IntraVENous 2 times per day    pantoprazole   40 mg IntraVENous BID     Continuous Infusions:    sodium chloride        PRN Meds: loperamide , simethicone , prochlorperazine , sodium chloride  flush, sodium chloride , potassium chloride  **OR** potassium alternative oral replacement **OR** potassium chloride , magnesium  sulfate, ondansetron  **OR** ondansetron , polyethylene glycol, acetaminophen  **OR** acetaminophen , acetaminophen , aluminum & magnesium  hydroxide-simethicone , bisacodyl , guaiFENesin -dextromethorphan, hydrALAZINE , HYDROcodone  5 mg - acetaminophen , melatonin, morphine , ondansetron       Physical Examination      Vitals:  BP (!) 144/72   Pulse 60   Temp 98.9 F (37.2 C) (Oral)   Resp 20   Ht 1.575 m (5' 2)   Wt 61.2 kg (135 lb)   SpO2 96%   BMI 24.69 kg/m   Temp (24hrs), Avg:98.5 F (36.9 C), Min:98.1 F (36.7 C), Max:98.9 F (37.2 C)      I/O (24Hr):    Intake/Output Summary (Last 24 hours) at 04/05/2024 0826  Last data filed at 04/05/2024 0017  Gross per 24 hour   Intake 1466.67 ml   Output 900 ml   Net 566.67 ml       General: Alert, NAD, husband at bedside  Neck: supple  Lungs: Clear to auscultation, on room air, nonlabored  Heart: Normal rate  Musculoskeletal: Warm, well perfused  Abdomen: Soft, non-tender  Neurologic: No focal weakness  Extremities: no edema      Labs/Imaging/Diagnostics       Labs:   Hematologic/Coags Chemistries   Recent Labs     04/03/24  1120 04/04/24  0535 04/05/24  0457   WBC 10.8* 4.8 5.0   HGB 12.5 10.1* 9.7*   HCT 37.9 31.7* 30.3*   PLT 233 181 171     Lab Results   Component Value Date/Time    ALBUMIN 3.5 04/04/2024 05:35 AM     No components found for: HGBA1C  Lab Results   Component Value Date/Time    INR 1.8 04/04/2024 05:35 AM    INR 2.0 04/03/2024 06:02 PM    PROTIME 21.0 04/04/2024 05:35 AM    PROTIME 22.4 04/03/2024 06:02 PM     No results found for: APTT  No results found for: DDIMER   Recent Labs     04/03/24  1120 04/03/24  1802 04/04/24  0535 04/04/24  1358 04/04/24  1633 04/05/24  0457   NA 140  --  144  --   --  141   K 3.5  --  3.0*   < > 3.5  3.4* 4.0   CL 99  --  107  --   --  108*   CO2  29  --  28  --   --  24   BUN 22  --  20  --   --  14   CREATININE 0.3*  --  0.4*  --   --  0.3*   PROTIME  --  22.4* 21.0*  --   --   --    ALBUMIN 4.3  --  3.5  --   --   --    BILITOT 0.67  --  0.55  --   --   --    ALKPHOS 80  --  64  --   --   --    AST 29  --  29  --   --   --    ALT 16  --  19  --   --   --     < > = values in this interval not displayed.     No results for input(s): GLU in the last 72 hours.  No results found for: CPK, CKMB, TROPONINI  No results found for: IRON, FERRITIN     Inflammatory/Respiratory Diabetes   No results found for: CRP  No results found for: ESR  ABGs:  No results found for: PHART, PO2ART, HCO3, PCO2ART   Lab Results   Component Value Date/Time    CREATININE 0.3 04/05/2024 04:57 AM                Imaging Last 24 Hours:  CTA Chest W/WO Contrast PE Eval  Result Date: 04/03/2024  CTA chest: 04/03/24 INDICATION:  hemoptysis COMPARISON: Same-day chest radiograph and CT abdomen TECHNIQUE: PE protocol (Imaging from the thoracic inlet through the hemidiaphragms following contrast in the pulmonary arterial phase. Axial soft tissue and lung 5 x 5 mm images. Additional multiplanar  3-D volumetric MIP reconstructions through the pulmonary arteries were generated and reviewed per protocol.) CT scanning was performed using radiation dose reduction techniques when appropriate, per system protocols. FINDINGS: Mediastinum And Related Regions: Heart: Moderately enlarged heart. Cardiac ICD present.   No pericardial effusion.   Coronary calcium: Mild Vascular:  No aortic aneurysm or dissection.     No pulmonary embolism identified to the subsegmental level.  Pulmonary artery normal caliber. Other: No enlarged lymph nodes.   Airways, Lungs, Pleura: Trachea and central airways are clear. Mild basilar atelectasis, additional mild  bibasilar groundglass patchy opacities which may be due to very mild pulmonary edema or infection. No dense consolidation. No worrisome solitary appearing pulmonary nodule.   No pleural effusion.   No pneumothorax.  Upper Abdomen: Unremarkable upper abdomen.  Bones: No acute or aggressive bony abnormality.  Additional findings, if any: None.      No pulmonary embolism.  Enlarged heart. Mild bibasilar groundglass patchy opacities which may be due to very mild pulmonary edema or infection, or alternatively small amount of pulmonary hemorrhage. .    XR CHEST (2 VW)  Result Date: 04/03/2024  Chest PA and lateral: 04/03/24 INDICATION: right opacities on CT abd/pelvis COMPARISON: Same-day CT abdomen. 05/25/2018 chest radiograph FINDINGS: Stable mildly enlarged cardiac silhouette, cardiac ICD present. Mild diffuse interstitial opacities. The lungs, pleura, heart, and mediastinal contours are otherwise unremarkable. No acute bony abnormality.     Enlarged right axilla. Mild diffuse interstitial opacities in the lungs may be due to pulmonary vascular congestion without overt edema, or interstitial pneumonia.    CT ABDOMEN PELVIS W IV CONTRAST Additional Contrast? None  Result Date: 04/03/2024  CT abdomen pelvis with contrast: 04/03/24 INDICATION: vomiting,  generalized abd pain COMPARISON:  August 2022 TECHNIQUE: Routine protocol (Axial portal venous phase imaging from the lung bases to the pubic symphysis following IV contrast with coronal reconstruction.)  CT scanning was performed using radiation dose reduction techniques when appropriate, per system protocols. FINDINGS: Lung bases: Cardiomegaly with severe biatrial enlargement. No pericardial effusion. Mild patchy airspace opacities in the right greater than left lung bases. No dense consolidation. No pleural effusion. Liver: No worrisome hepatic lesion. Likely a few scattered small shunts present in the subcapsular right lobe. Gallbladder/biliary: Unremarkable. No evidence of biliary obstruction. Pancreas: Unremarkable. Spleen: Unremarkable. Adrenal Glands: Unremarkable. Kidneys: No hydronephrosis.. Small cyst in the left kidney. Gastrointestinal Tract: No evidence of obstruction. Normal appendix. A few uncomplicated colonic diverticula are present. Vasculature: No abdominal aortic aneurysm. Lymph Nodes: No lymphadenopathy. Peritoneum: No free fluid or gas. Bladder: Unremarkable. Pelvic Reproductive Organs: Unremarkable. Osseous Structures/Soft tissues: Degenerative changes of the spine and pelvis with no suspicious lytic or blastic osseous lesions. Mild grade 1 anterolisthesis of L4 on L5, likely degenerative with no pars defect. Degenerative disc disease here is severe.     No acute process in the abdomen or pelvis. Mild degree of patchy airspace opacities in the right greater than left lung bases suspicious for pneumonia        Assessment        Hospital Problems           Last Modified POA    * (Principal) GI bleed 04/03/2024 Yes       Plan:        Hemoptysis  Possible pneumonia/aspiration  Chronic A-fib on Coumadin    - Hemoptysis is resolved, CT chest with nonspecific ground glass opacities  - Can complete an empiric course of antibiotics outpatient although procalcitonin and VRP negative  - Can restart Coumadin and monitor for recurrence  outpatient  - If she has any persistent respiratory issues outpatient can follow-up with our practice and arrange for lung function testing and repeat imaging as needed          Lorrene Hopper, DO  Pulmonary & Critical Care Physician  [facility]        Electronically signed by Lorrene Faden Chrysta Fulcher, DO on 04/05/24 at 8:26 AM EDT     "

## 2024-04-05 NOTE — Discharge Summary (Signed)
 "                                                     [facility] Hospitalist Service                                                                        DISCHARGE SUMMARY      Date:04/05/2024        Patient Name:Mia Roy     Date of Birth:07-11-1945     Age:78 y.o.    Admit Date:04/03/2024   Admission Condition:stable   Discharged Condition:stable  Discharge Date: 04/05/24     Discharge Diagnoses     Hemoptysis  Possible Mallory Weiss tear  Acute gastroenteritis  Aspiration Pneumonia      Hospital Stay   Per HPI  Mia Roy is a 78 y.o. female with a PMHx significant for atrial fibrillation on anticoagulation presents with hematemesis and hemoptysis     Patient is manage on Coumadin and had INR 2.0 today     Patient was seen 2 separate times today     Earlier in the day patient was seen for what appeared to be gastroenteritis with vomiting that started 11:30 PM and went every 20 minutes all night long with diarrhea starting later in the evening with just as much frequency, the vomiting stopped at 8 AM there is nothing more to vomit other than streaks of blood.  They noticed pneumonia on a CT scan of the abdomen.     The patient was seen in the ED and sent home on Augmentin      When the patient t came home she began having these quarter sized flecks of hemoptysis, this concerned her so she presents here for further evaluation, her second of the day.     Patient denies any blood in her stool or dark tarry stool.     Lactic acid initially elevated 2.1 but downtrending to 1.6 after fluids.     CT scan of the chest was pursued that ruled out PE but confirmed pneumonia.     No pulmonary embolism.  Enlarged heart. Mild bibasilar groundglass patchy   opacities which may be due to very mild pulmonary edema or infection, or   alternatively small amount of pulmonary hemorrhage.     Mild degree of patchy airspace opacities in the right greater than left lung   bases suspicious for pneumonia     No acute process in the  abdomen or pelvis.     Mild degree of patchy airspace opacities in the right greater than left lung   bases suspicious for pneumonia     INR 1.0 hemoglobin 12.5 white blood cell count 10.8 platelet count 233.    Narrative of Hospital Course:   Hemoptysis:   Suspect due to possible mallory weiss tear after episodes of vomiting.  Hold coumadin for now and monitor  She can resume coumadin upon discharge if no further bleeding episodes     Nausea/Vomiting  Suspect due to gastroenteritis and has improved  Episode of bleeding likely a MW tear  Continue IV protonix      Pneumonia:   Suspect aspration PNA from multiple episodes of vomiting  Will switch to Unasyn  and then augmentin  upon discharge     Atrial fibrillation:   Patient is on coumadin at home which I expect she can resume once no more bleeding reported     Hypokalemia:   Resolved    Consultants:   IP CONSULT TO GI  IP CONSULT TO PULMONOLOGY          Surgeries/Procedures Performed:           Significant Diagnostic Studies:     Radiology Last 7 Days:  CTA Chest W/WO Contrast PE Eval  Result Date: 04/03/2024  No pulmonary embolism.  Enlarged heart. Mild bibasilar groundglass patchy opacities which may be due to very mild pulmonary edema or infection, or alternatively small amount of pulmonary hemorrhage. .    XR CHEST (2 VW)  Result Date: 04/03/2024  Enlarged right axilla. Mild diffuse interstitial opacities in the lungs may be due to pulmonary vascular congestion without overt edema, or interstitial pneumonia.    CT ABDOMEN PELVIS W IV CONTRAST Additional Contrast? None  Result Date: 04/03/2024  No acute process in the abdomen or pelvis. Mild degree of patchy airspace opacities in the right greater than left lung bases suspicious for pneumonia      Physical exam   Vitals:  BP (!) 123/56   Pulse 60   Temp 97.7 F (36.5 C) (Oral)   Resp 17   Ht 1.575 m (5' 2)   Wt 61.2 kg (135 lb)   SpO2 96%   BMI 24.69 kg/m   Temp (24hrs), Avg:98.4 F (36.9 C), Min:97.7 F  (36.5 C), Max:98.9 F (37.2 C)      I/O (24Hr):    Intake/Output Summary (Last 24 hours) at 04/05/2024 1014  Last data filed at 04/05/2024 0017  Gross per 24 hour   Intake 1266.67 ml   Output 400 ml   Net 866.67 ml       General: Alert and oriented, no acute distress.  Lungs: non-labored respiration.  Heart: Normal rate, regular rhythm  Abdomen: Soft, non-tender  Discharge Plan   Disposition: Home    Provider Follow-Up:   [facility]  [address]  [place]  [address]  [phone]  Follow up  Patient needs a screening colonoscpy. Patient has a know pacemaker.       It is critical that you make your follow-up appointment(s). If you are discharged on the weekend or after business hours, or if we are unable to schedule these appointments for you for any reason, you or a family member need to call during the next business day to schedule your appointment(s).      Patient Instructions       Activity: activity as tolerated      Discharge Medications         Medication List        PAUSE taking these medications      aspirin 81 MG EC tablet  Wait to take this until your doctor or other care provider tells you to start again.     hydroCHLOROthiazide 25 MG tablet  Wait to take this until your doctor or other care provider tells you to start again.  Commonly known as: HYDRODIURIL            START taking these medications      loperamide  2 MG capsule  Commonly known as: IMODIUM   Take 1 capsule by mouth every 6 hours as needed for Diarrhea            CHANGE how you take these medications      amoxicillin -clavulanate 875-125 MG per tablet  Commonly known as: AUGMENTIN   Take 1 tablet by mouth every 12 hours for 5 days  What changed: when to take this            CONTINUE taking these medications      dicyclomine  20 MG tablet  Commonly known as: BENTYL   Take 1 tablet by mouth every 6 hours as needed (Abdominal Pain)     multivitamin Tabs tablet     traZODone  50 MG tablet  Commonly known as:  DESYREL      warfarin 5 MG tablet  Commonly known as: COUMADIN            STOP taking these medications      ondansetron  4 MG disintegrating tablet  Commonly known as: ZOFRAN -ODT               Where to Get Your Medications        These medications were sent to [facility] [address] [place], [place] - [address] - P [phone] [phone]  [address], [place] [address]      Phone: [phone]   amoxicillin -clavulanate 875-125 MG per tablet  loperamide  2 MG capsule           Time Spent on Discharge:  Greater than 30 minutes were spent in patient examination, evaluation, counseling as well as medication reconciliation, prescriptions for required medications, discharge plan and follow up.  Electronically signed by Alonso Rosezella Gustin, MD on 04/05/24 at 10:08 AM EDT   "

## 2024-04-05 NOTE — Plan of Care (Signed)
"    Problem: Discharge Planning  Goal: Discharge to home or other facility with appropriate resources  04/05/2024 1046 by Helen Been, RN  Outcome: Progressing  Flowsheets (Taken 04/05/2024 0810)  Discharge to home or other facility with appropriate resources:   Identify barriers to discharge with patient and caregiver   Arrange for needed discharge resources and transportation as appropriate   Identify discharge learning needs (meds, wound care, etc)   Arrange for interpreters to assist at discharge as needed   Refer to discharge planning if patient needs post-hospital services based on physician order or complex needs related to functional status, cognitive ability or social support system  04/04/2024 2157 by Haroldine Leontine Blunt, RN  Outcome: Progressing  Flowsheets (Taken 04/04/2024 0840 by Helen Been, RN)  Discharge to home or other facility with appropriate resources:   Identify barriers to discharge with patient and caregiver   Arrange for needed discharge resources and transportation as appropriate   Identify discharge learning needs (meds, wound care, etc)   Refer to discharge planning if patient needs post-hospital services based on physician order or complex needs related to functional status, cognitive ability or social support system   Arrange for interpreters to assist at discharge as needed     Problem: Safety - Adult  Goal: Free from fall injury  04/05/2024 1046 by Helen Been, RN  Outcome: Progressing  04/04/2024 2157 by Haroldine Leontine Blunt, RN  Outcome: Progressing     Problem: ABCDS Injury Assessment  Goal: Absence of physical injury  04/05/2024 1046 by Helen Been, RN  Outcome: Progressing  04/04/2024 2157 by Haroldine Leontine Blunt, RN  Outcome: Progressing     Problem: Pain  Goal: Verbalizes/displays adequate comfort level or baseline comfort level  04/05/2024 1046 by Helen Been, RN  Outcome: Progressing  Flowsheets (Taken 04/05/2024 0915)  Verbalizes/displays adequate  comfort level or baseline comfort level:   Encourage patient to monitor pain and request assistance   Assess pain using appropriate pain scale   Implement non-pharmacological measures as appropriate and evaluate response   Administer analgesics based on type and severity of pain and evaluate response   Consider cultural and social influences on pain and pain management   Notify Licensed Independent Practitioner if interventions unsuccessful or patient reports new pain  04/04/2024 2157 by Haroldine Leontine Blunt, RN  Outcome: Progressing     "

## 2024-04-05 NOTE — Care Coordination (Signed)
"  04/05/24  CJS:  Pt will discharge to home today with no indication for post-acute care services.  No CM needs.        Date: 04/04/24 Time 1725      Initial assessment completed on  Mia Roy via IN PERSON to determine transition of care needs. CM also spoke with the patient and spouse to discuss discharge needs.     Pt currently hospitalized for GI bleed [K92.2]  Hemoptysis [R04.2].   Patient lives with: Spouse   Type of Home: House     Current services prior to admission: Durable Medical Equipment    Current DME: Cane      Pt fills medications at   [facility] [address] [place], [place] - [address] - P [phone] [address] [phone]  [address]  [place] [address]  Phone: [phone] Fax: [phone]      Discussed discharge options with the patient and spouse and they would like to consider the following discharge plan: Home         Case Manager will follow to finalize plans and work to transition patient to the next level of care.       Completed by: Duwaine Dickens BSN, RN    Case Management Department        04/04/24 1802   Service Assessment   Information Provided By Patient;Medical Record;Spouse   Patient Orientation Alert;Oriented   Cognition No Apparent Deficit   Primary Caregiver Self   Support Systems Spouse/Significant Other;Children   PCP Verified by CM Yes  (Blessing-Feussner, Niels [place], [place])   Prior Functional Level Independent in ADLs/IADLs   History of falls? 0   Can patient return to prior living arrangement Yes   Ability to make needs known: Good   Family able to assist with home care needs: Yes   Other than yourself, who should be included in your transition plan for discharge from the hospital? Frett,Carl (Spouse)  [phone]   Receives Help From Family   Social/Functional History   Prior Level of Assist for ADLs Independent   Prior Level of Assist for [employer] Independent   Prior Level of Assist for Transfers Independent   Ambulation Assistance  Independent   Active Driver No   Patient's Driver Info Wescoat,Carl (Spouse)  [phone]   Occupation Retired   Building Control Surveyor    Location Prior to Acute Admission House   Lives With Spouse   Current Services Prior To Admission Durable Medical Equipment   Current DME Prior to [employer] Needed N/A   Potential Assistance Obtaining Medications No   Patient expects to be discharged to: Home   Home Layout One level   Entrance Stairs - Number of Steps 1   Services At/After Discharge   Who will provide transportation at discharge? Family  (Romanek,Carl (Spouse)  [phone])       "

## 2024-04-06 LAB — STREP PNEUMONIAE ANTIGEN: Strep pneumo Ag, Ur: NEGATIVE

## 2024-04-06 LAB — MYCOPLASMA PNEUMONIAE ANTIBODY, IGM: Mycoplasma pneumo IgM: <770 U/mL (ref 0–769)

## 2024-04-06 LAB — LEGIONELLA ANTIGEN, URINE: Legionella Pneumophilia Ag, Urine: NEGATIVE
